# Patient Record
Sex: Female | Born: 1950 | Race: Black or African American | Hispanic: No | Marital: Single | Smoking: Current every day smoker
Health system: Southern US, Community
[De-identification: ages and names within clinical notes are randomized; demographics above are authoritative.]

## PROBLEM LIST (undated history)

## (undated) DIAGNOSIS — F1021 Alcohol dependence, in remission: Secondary | ICD-10-CM

## (undated) DIAGNOSIS — M069 Rheumatoid arthritis, unspecified: Secondary | ICD-10-CM

## (undated) DIAGNOSIS — W3400XA Accidental discharge from unspecified firearms or gun, initial encounter: Secondary | ICD-10-CM

## (undated) DIAGNOSIS — I10 Essential (primary) hypertension: Secondary | ICD-10-CM

## (undated) DIAGNOSIS — T7840XA Allergy, unspecified, initial encounter: Secondary | ICD-10-CM

## (undated) DIAGNOSIS — H409 Unspecified glaucoma: Secondary | ICD-10-CM

## (undated) DIAGNOSIS — D071 Carcinoma in situ of vulva: Secondary | ICD-10-CM

## (undated) DIAGNOSIS — F329 Major depressive disorder, single episode, unspecified: Secondary | ICD-10-CM

## (undated) DIAGNOSIS — F32A Depression, unspecified: Secondary | ICD-10-CM

## (undated) DIAGNOSIS — H269 Unspecified cataract: Secondary | ICD-10-CM

## (undated) HISTORY — DX: Major depressive disorder, single episode, unspecified: F32.9

## (undated) HISTORY — DX: Allergy, unspecified, initial encounter: T78.40XA

## (undated) HISTORY — PX: TOTAL ABDOMINAL HYSTERECTOMY W/ BILATERAL SALPINGOOPHORECTOMY: SHX83

## (undated) HISTORY — DX: Rheumatoid arthritis, unspecified: M06.9

## (undated) HISTORY — DX: Accidental discharge from unspecified firearms or gun, initial encounter: W34.00XA

## (undated) HISTORY — PX: ABDOMINAL HYSTERECTOMY: SHX81

## (undated) HISTORY — PX: CYSTECTOMY: SUR359

## (undated) HISTORY — PX: OTHER SURGICAL HISTORY: SHX169

## (undated) HISTORY — PX: CATARACT EXTRACTION: SUR2

## (undated) HISTORY — PX: CARPAL TUNNEL RELEASE: SHX101

## (undated) HISTORY — DX: Unspecified glaucoma: H40.9

## (undated) HISTORY — DX: Carcinoma in situ of vulva: D07.1

## (undated) HISTORY — DX: Depression, unspecified: F32.A

## (undated) HISTORY — PX: TUBAL LIGATION: SHX77

## (undated) HISTORY — PX: BREAST LUMPECTOMY: SHX2

## (undated) HISTORY — DX: Essential (primary) hypertension: I10

## (undated) HISTORY — DX: Unspecified cataract: H26.9

---

## 2005-09-01 ENCOUNTER — Ambulatory Visit: Payer: Self-pay | Admitting: Family Medicine

## 2005-09-12 ENCOUNTER — Ambulatory Visit: Payer: Self-pay | Admitting: Family Medicine

## 2005-12-15 ENCOUNTER — Ambulatory Visit: Payer: Self-pay | Admitting: Family Medicine

## 2006-03-19 ENCOUNTER — Ambulatory Visit: Payer: Self-pay | Admitting: Family Medicine

## 2011-05-11 ENCOUNTER — Encounter: Payer: Self-pay | Admitting: Rheumatology

## 2011-06-02 ENCOUNTER — Encounter: Payer: Self-pay | Admitting: Rheumatology

## 2012-09-01 HISTORY — PX: COLONOSCOPY: SHX174

## 2012-09-02 ENCOUNTER — Ambulatory Visit: Payer: Self-pay | Admitting: Gastroenterology

## 2012-10-22 ENCOUNTER — Ambulatory Visit: Payer: Self-pay | Admitting: Gynecologic Oncology

## 2012-11-12 ENCOUNTER — Ambulatory Visit: Payer: Self-pay | Admitting: Gynecologic Oncology

## 2012-12-02 ENCOUNTER — Ambulatory Visit: Payer: Self-pay | Admitting: Gynecologic Oncology

## 2012-12-24 ENCOUNTER — Ambulatory Visit: Payer: Self-pay | Admitting: Gynecologic Oncology

## 2012-12-24 DIAGNOSIS — I10 Essential (primary) hypertension: Secondary | ICD-10-CM

## 2012-12-24 LAB — CBC
HGB: 12.7 g/dL (ref 12.0–16.0)
MCH: 35.5 pg — ABNORMAL HIGH (ref 26.0–34.0)
MCV: 103 fL — ABNORMAL HIGH (ref 80–100)
Platelet: 292 10*3/uL (ref 150–440)
RBC: 3.57 10*6/uL — ABNORMAL LOW (ref 3.80–5.20)

## 2012-12-24 LAB — BASIC METABOLIC PANEL
Co2: 28 mmol/L (ref 21–32)
Creatinine: 0.76 mg/dL (ref 0.60–1.30)
EGFR (African American): >60
Glucose: 88 mg/dL (ref 65–99)
Osmolality: 283 (ref 275–301)
Potassium: 3.8 mmol/L (ref 3.5–5.1)
Sodium: 142 mmol/L (ref 136–145)

## 2013-01-01 ENCOUNTER — Ambulatory Visit: Payer: Self-pay | Admitting: Gynecologic Oncology

## 2013-01-01 DIAGNOSIS — D071 Carcinoma in situ of vulva: Secondary | ICD-10-CM

## 2013-01-01 HISTORY — PX: OTHER SURGICAL HISTORY: SHX169

## 2013-01-01 HISTORY — DX: Carcinoma in situ of vulva: D07.1

## 2013-01-07 ENCOUNTER — Ambulatory Visit: Payer: Self-pay | Admitting: Gynecologic Oncology

## 2013-01-08 ENCOUNTER — Ambulatory Visit: Payer: Self-pay | Admitting: Gynecologic Oncology

## 2013-01-09 LAB — PATHOLOGY REPORT

## 2013-02-01 ENCOUNTER — Ambulatory Visit: Payer: Self-pay | Admitting: Gynecologic Oncology

## 2013-02-11 ENCOUNTER — Ambulatory Visit: Payer: Self-pay | Admitting: Gynecologic Oncology

## 2013-03-03 ENCOUNTER — Ambulatory Visit: Payer: Self-pay | Admitting: Gynecologic Oncology

## 2013-04-08 ENCOUNTER — Ambulatory Visit: Payer: Self-pay | Admitting: Gynecologic Oncology

## 2013-05-04 ENCOUNTER — Ambulatory Visit: Payer: Self-pay | Admitting: Gynecologic Oncology

## 2013-09-22 ENCOUNTER — Ambulatory Visit: Payer: Self-pay | Admitting: Gynecologic Oncology

## 2013-10-01 ENCOUNTER — Ambulatory Visit: Payer: Self-pay | Admitting: Gynecologic Oncology

## 2013-12-30 DIAGNOSIS — M059 Rheumatoid arthritis with rheumatoid factor, unspecified: Secondary | ICD-10-CM | POA: Insufficient documentation

## 2014-05-13 ENCOUNTER — Ambulatory Visit: Payer: Self-pay | Admitting: Obstetrics and Gynecology

## 2014-06-02 ENCOUNTER — Ambulatory Visit
Admit: 2014-06-02 | Disposition: A | Discharge status: Home / Self Care | Payer: Self-pay | Attending: Obstetrics and Gynecology | Admitting: Obstetrics and Gynecology

## 2014-07-24 NOTE — Op Note (Signed)
PATIENT NAME:  Summer Marshall, Summer Marshall MR#:  [id number] DATE OF BIRTH:  1950-07-10  DATE OF PROCEDURE:  01/07/2013  PREOPERATIVE DIAGNOSIS: VIN-III.   POSTOPERATIVE DIAGNOSIS: VIN-III.   PROCEDURE PERFORMED: Wide local excision.   SURGEON: Weber Cooks, M.D.   ANESTHESIA: General.   COMPLICATIONS: None.   ESTIMATED BLOOD LOSS: About 25 mL.  INDICATION FOR SURGERY: Mrs. Mee is a 64 year old patient, who presented with vulvar irritation and on examination was noted to have a thickened epithelium on the perineal biopsy of which was consistent with VIN-III. There was no suspicion of invasion;. therefore, the decision was made to proceed with wide local excision.   OPERATIVE REPORT: After adequate general anesthesia being obtained, the patient was prepped and draped in high lithotomy position. Staining of the vagina with Lugol solution failed to reveal any vaginal pathology. The perineal lesion was excised with a 1 cm margin. Then using cautery, the lesion detached from the underlying tissue. The lateral margins were then mobilized by blunt dissection. The vaginal wall was likewise freed from the rectum and mobilized downwards. Hemostasis was achieved with cautery. The subcutaneous tissue was then reapproximated with interrupted 2-0 Vicryl stitches and the skin was closed with 3-0 Vicryl using mattress sutures. Hemostasis was adequate at the end of the procedure. Rectal exam was unremarkable. The patient tolerated the procedure well and was taken to the recovery room in stable condition after Silvadene cream was placed over the incision. Pad, sponge, needle, and instrument counts were correct x 2.    ____________________________ Weber Cooks, MD bem:aw D: 01/08/2013 08:59:05 ET T: 01/08/2013 09:14:53 ET JOB#: [id number]  cc: Weber Cooks, MD, <Dictator> Weber Cooks MD ELECTRONICALLY SIGNED 01/14/2013 17:25

## 2014-11-11 ENCOUNTER — Ambulatory Visit: Payer: Medicare Other

## 2014-11-24 ENCOUNTER — Encounter: Payer: Self-pay | Admitting: *Deleted

## 2014-11-25 ENCOUNTER — Inpatient Hospital Stay: Payer: Medicare Other | Attending: Obstetrics and Gynecology | Admitting: Obstetrics and Gynecology

## 2014-11-25 ENCOUNTER — Encounter: Payer: Self-pay | Admitting: Obstetrics and Gynecology

## 2014-11-25 VITALS — BP 131/83 | HR 73 | Temp 98.2°F | Resp 18 | Ht 61.0 in | Wt 132.5 lb

## 2014-11-25 DIAGNOSIS — R5383 Other fatigue: Secondary | ICD-10-CM | POA: Diagnosis not present

## 2014-11-25 DIAGNOSIS — Z79899 Other long term (current) drug therapy: Secondary | ICD-10-CM | POA: Diagnosis not present

## 2014-11-25 DIAGNOSIS — M069 Rheumatoid arthritis, unspecified: Secondary | ICD-10-CM | POA: Insufficient documentation

## 2014-11-25 DIAGNOSIS — I10 Essential (primary) hypertension: Secondary | ICD-10-CM | POA: Diagnosis not present

## 2014-11-25 DIAGNOSIS — R630 Anorexia: Secondary | ICD-10-CM | POA: Insufficient documentation

## 2014-11-25 DIAGNOSIS — L818 Other specified disorders of pigmentation: Secondary | ICD-10-CM | POA: Diagnosis not present

## 2014-11-25 DIAGNOSIS — Z87412 Personal history of vulvar dysplasia: Secondary | ICD-10-CM | POA: Diagnosis not present

## 2014-11-25 DIAGNOSIS — F17210 Nicotine dependence, cigarettes, uncomplicated: Secondary | ICD-10-CM | POA: Insufficient documentation

## 2014-11-25 DIAGNOSIS — F329 Major depressive disorder, single episode, unspecified: Secondary | ICD-10-CM | POA: Diagnosis not present
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
## 2014-11-25 NOTE — Patient Instructions (Signed)
Calorie Counting for Weight Loss Calories are energy you get from the things you eat and drink. Your body uses this energy to keep you going throughout the day. The number of calories you eat affects your weight. When you eat more calories than your body needs, your body stores the extra calories as fat. When you eat fewer calories than your body needs, your body burns fat to get the energy it needs. Calorie counting means keeping track of how many calories you eat and drink each day. If you make sure to eat fewer calories than your body needs, you should lose weight. In order for calorie counting to work, you will need to eat the number of calories that are right for you in a day to lose a healthy amount of weight per week. A healthy amount of weight to lose per week is usually 1-2 lb (0.5-0.9 kg). A dietitian can determine how many calories you need in a day and give you suggestions on how to reach your calorie goal.  . WHAT DO I NEED TO KNOW ABOUT CALORIE COUNTING? In order to meet your daily calorie goal, you will need to:  Find out how many calories are in each food you would like to eat. Try to do this before you eat.  Decide how much of the food you can eat.  Write down what you ate and how many calories it had. Doing this is called keeping a food log. WHERE DO I FIND CALORIE INFORMATION? The number of calories in a food can be found on a Nutrition Facts label. Note that all the information on a label is based on a specific serving of the food. If a food does not have a Nutrition Facts label, try to look up the calories online or ask your dietitian for help. HOW DO I DECIDE HOW MUCH TO EAT? To decide how much of the food you can eat, you will need to consider both the number of calories in one serving and the size of one serving. This information can be found on the Nutrition Facts label. If a food does not have a Nutrition Facts label, look up the information online or ask your dietitian for  help. Remember that calories are listed per serving. If you choose to have more than one serving of a food, you will have to multiply the calories per serving by the amount of servings you plan to eat. For example, the label on a package of bread might say that a serving size is 1 slice and that there are 90 calories in a serving. If you eat 1 slice, you will have eaten 90 calories. If you eat 2 slices, you will have eaten 180 calories. HOW DO I KEEP A FOOD LOG? After each meal, record the following information in your food log:  What you ate.  How much of it you ate.  How many calories it had.  Then, add up your calories. Keep your food log near you, such as in a small notebook in your pocket. Another option is to use a mobile app or website. Some programs will calculate calories for you and show you how many calories you have left each time you add an item to the log. WHAT ARE SOME CALORIE COUNTING TIPS?  Use your calories on foods and drinks that will fill you up and not leave you hungry. Some examples of this include foods like nuts and nut butters, vegetables, lean proteins, and high-fiber foods (more  than 5 g fiber per serving).  Eat nutritious foods and avoid empty calories. Empty calories are calories you get from foods or beverages that do not have many nutrients, such as candy and soda. It is better to have a nutritious high-calorie food (such as an avocado) than a food with few nutrients (such as a bag of chips).  Know how many calories are in the foods you eat most often. This way, you do not have to look up how many calories they have each time you eat them.  Look out for foods that may seem like low-calorie foods but are really high-calorie foods, such as baked goods, soda, and fat-free candy.  Pay attention to calories in drinks. Drinks such as sodas, specialty coffee drinks, alcohol, and juices have a lot of calories yet do not fill you up. Choose low-calorie drinks like water  and diet drinks.  Focus your calorie counting efforts on higher calorie items. Logging the calories in a garden salad that contains only vegetables is less important than calculating the calories in a milk shake.  Find a way of tracking calories that works for you. Get creative. Most people who are successful find ways to keep track of how much they eat in a day, even if they do not count every calorie. WHAT ARE SOME PORTION CONTROL TIPS?  Know how many calories are in a serving. This will help you know how many servings of a certain food you can have.  Use a measuring cup to measure serving sizes. This is helpful when you start out. With time, you will be able to estimate serving sizes for some foods.  Take some time to put servings of different foods on your favorite plates, bowls, and cups so you know what a serving looks like.  Try not to eat straight from a bag or box. Doing this can lead to overeating. Put the amount you would like to eat in a cup or on a plate to make sure you are eating the right portion.  Use smaller plates, glasses, and bowls to prevent overeating. This is a quick and easy way to practice portion control. If your plate is smaller, less food can fit on it.  Try not to multitask while eating, such as watching TV or using your computer. If it is time to eat, sit down at a table and enjoy your food. Doing this will help you to start recognizing when you are full. It will also make you more aware of what and how much you are eating. HOW CAN I CALORIE COUNT WHEN EATING OUT?  Ask for smaller portion sizes or child-sized portions.  Consider sharing an entree and sides instead of getting your own entree.  If you get your own entree, eat only half. Ask for a box at the beginning of your meal and put the rest of your entree in it so you are not tempted to eat it.  Look for the calories on the menu. If calories are listed, choose the lower calorie options.  Choose dishes  that include vegetables, fruits, whole grains, low-fat dairy products, and lean protein. Focusing on smart food choices from each of the 5 food groups can help you stay on track at restaurants.  Choose items that are boiled, broiled, grilled, or steamed.  Choose water, milk, unsweetened iced tea, or other drinks without added sugars. If you want an alcoholic beverage, choose a lower calorie option. For example, a regular margarita can have up   to 700 calories and a glass of wine has around 150.  Stay away from items that are buttered, battered, fried, or served with cream sauce. Items labeled "crispy" are usually fried, unless stated otherwise.  Ask for dressings, sauces, and syrups on the side. These are usually very high in calories, so do not eat much of them.  Watch out for salads. Many people think salads are a healthy option, but this is often not the case. Many salads come with bacon, fried chicken, lots of cheese, fried chips, and dressing. All of these items have a lot of calories. If you want a salad, choose a garden salad and ask for grilled meats or steak. Ask for the dressing on the side, or ask for olive oil and vinegar or lemon to use as dressing.  Estimate how many servings of a food you are given. For example, a serving of cooked rice is  cup or about the size of half a tennis ball or one cupcake wrapper. Knowing serving sizes will help you be aware of how much food you are eating at restaurants. The list below tells you how big or small some common portion sizes are based on everyday objects.  1 oz--4 stacked dice.  3 oz--1 deck of cards.  1 tsp--1 dice.  1 Tbsp-- a Ping-Pong ball.  2 Tbsp--1 Ping-Pong ball.   cup--1 tennis ball or 1 cupcake wrapper.  1 cup--1 baseball. Document Released: 03/20/2005 Document Revised: 08/04/2013 Document Reviewed: 01/23/2013 [facility] Patient Information 2015 [place], [place]. This information is not intended to replace advice given to  you by your health care provider. Make sure you discuss any questions you have with your health care provider.   Exercise to Lose Weight Exercise and a healthy diet may help you lose weight. Your doctor may suggest specific exercises. EXERCISE IDEAS AND TIPS  Choose low-cost things you enjoy doing, such as walking, bicycling, or exercising to workout videos.  Take stairs instead of the elevator.  Walk during your lunch break.  Park your car further away from work or school.  Go to a gym or an exercise class.  Start with 5 to 10 minutes of exercise each day. Build up to 30 minutes of exercise 4 to 6 days a week.  Wear shoes with good support and comfortable clothes.  Stretch before and after working out.  Work out until you breathe harder and your heart beats faster.  Drink extra water when you exercise.  Do not do so much that you hurt yourself, feel dizzy, or get very short of breath. Exercises that burn about 150 calories:  Running 1  miles in 15 minutes.  Playing volleyball for 45 to 60 minutes.  Washing and waxing a car for 45 to 60 minutes.  Playing touch football for 45 minutes.  Walking 1  miles in 35 minutes.  Pushing a stroller 1  miles in 30 minutes.  Playing basketball for 30 minutes.  Raking leaves for 30 minutes.  Bicycling 5 miles in 30 minutes.  Walking 2 miles in 30 minutes.  Dancing for 30 minutes.  Shoveling snow for 15 minutes.  Swimming laps for 20 minutes.  Walking up stairs for 15 minutes.  Bicycling 4 miles in 15 minutes.  Gardening for 30 to 45 minutes.  Jumping rope for 15 minutes.  Washing windows or floors for 45 to 60 minutes. Document Released: 04/22/2010 Document Revised: 06/12/2011 Document Reviewed: 04/22/2010 [facility] Patient Information 2015 [place], [place]. This information is not intended  to replace advice given to you by your health care provider. Make sure you discuss any questions you have with your health  care provider.  Smoking Cessation Quitting smoking is important to your health and has many advantages. However, it is not always easy to quit since nicotine is a very addictive drug. Oftentimes, people try 3 times or more before being able to quit. This document explains the best ways for you to prepare to quit smoking. Quitting takes hard work and a lot of effort, but you can do it. ADVANTAGES OF QUITTING SMOKING  You will live longer, feel better, and live better.  Your body will feel the impact of quitting smoking almost immediately.  Within 20 minutes, blood pressure decreases. Your pulse returns to its normal level.  After 8 hours, carbon monoxide levels in the blood return to normal. Your oxygen level increases.  After 24 hours, the chance of having a heart attack starts to decrease. Your breath, hair, and body stop smelling like smoke.  After 48 hours, damaged nerve endings begin to recover. Your sense of taste and smell improve.  After 72 hours, the body is virtually free of nicotine. Your bronchial tubes relax and breathing becomes easier.  After 2 to 12 weeks, lungs can hold more air. Exercise becomes easier and circulation improves.  The risk of having a heart attack, stroke, cancer, or lung disease is greatly reduced.  After 1 year, the risk of coronary heart disease is cut in half.  After 5 years, the risk of stroke falls to the same as a nonsmoker.  After 10 years, the risk of lung cancer is cut in half and the risk of other cancers decreases significantly.  After 15 years, the risk of coronary heart disease drops, usually to the level of a nonsmoker.  If you are pregnant, quitting smoking will improve your chances of having a healthy baby.  The people you live with, especially any children, will be healthier.  You will have extra money to spend on things other than cigarettes. QUESTIONS TO THINK ABOUT BEFORE ATTEMPTING TO QUIT You may want to talk about your  answers with your health care provider.  Why do you want to quit?  If you tried to quit in the past, what helped and what did not?  What will be the most difficult situations for you after you quit? How will you plan to handle them?  Who can help you through the tough times? Your family? Friends? A health care provider?  What pleasures do you get from smoking? What ways can you still get pleasure if you quit? Here are some questions to ask your health care provider:  How can you help me to be successful at quitting?  What medicine do you think would be best for me and how should I take it?  What should I do if I need more help?  What is smoking withdrawal like? How can I get information on withdrawal? GET READY  Set a quit date.  Change your environment by getting rid of all cigarettes, ashtrays, matches, and lighters in your home, car, or work. Do not let people smoke in your home.  Review your past attempts to quit. Think about what worked and what did not. GET SUPPORT AND ENCOURAGEMENT You have a better chance of being successful if you have help. You can get support in many ways.  Tell your family, friends, and coworkers that you are going to quit and need their support.  Ask them not to smoke around you.  Get individual, group, or telephone counseling and support. Programs are available at [employer] and health centers. Call your local health department for information about programs in your area.  Spiritual beliefs and practices may help some smokers quit.  Download a "quit meter" on your computer to keep track of quit statistics, such as how long you have gone without smoking, cigarettes not smoked, and money saved.  Get a self-help book about quitting smoking and staying off tobacco. [place] yourself from urges to smoke. Talk to someone, go for a walk, or occupy your time with a task.  Change your normal routine. Take a different  route to work. Drink tea instead of coffee. Eat breakfast in a different place.  Reduce your stress. Take a hot bath, exercise, or read a book.  Plan something enjoyable to do every day. Reward yourself for not smoking.  Explore interactive web-based programs that specialize in helping you quit. GET MEDICINE AND USE IT CORRECTLY Medicines can help you stop smoking and decrease the urge to smoke. Combining medicine with the above behavioral methods and support can greatly increase your chances of successfully quitting smoking.  Nicotine replacement therapy helps deliver nicotine to your body without the negative effects and risks of smoking. Nicotine replacement therapy includes nicotine gum, lozenges, inhalers, nasal sprays, and skin patches. Some may be available over-the-counter and others require a prescription.  Antidepressant medicine helps people abstain from smoking, but how this works is unknown. This medicine is available by prescription.  Nicotinic receptor partial agonist medicine simulates the effect of nicotine in your brain. This medicine is available by prescription. Ask your health care provider for advice about which medicines to use and how to use them based on your health history. Your health care provider will tell you what side effects to look out for if you choose to be on a medicine or therapy. Carefully read the information on the package. Do not use any other product containing nicotine while using a nicotine replacement product.  RELAPSE OR DIFFICULT SITUATIONS Most relapses occur within the first 3 months after quitting. Do not be discouraged if you start smoking again. Remember, most people try several times before finally quitting. You may have symptoms of withdrawal because your body is used to nicotine. You may crave cigarettes, be irritable, feel very hungry, cough often, get headaches, or have difficulty concentrating. The withdrawal symptoms are only temporary. They  are strongest when you first quit, but they will go away within 10-14 days. To reduce the chances of relapse, try to:  Avoid drinking alcohol. Drinking lowers your chances of successfully quitting.  Reduce the amount of caffeine you consume. Once you quit smoking, the amount of caffeine in your body increases and can give you symptoms, such as a rapid heartbeat, sweating, and anxiety.  Avoid smokers because they can make you want to smoke.  Do not let weight gain distract you. Many smokers will gain weight when they quit, usually less than 10 pounds. Eat a healthy diet and stay active. You can always lose the weight gained after you quit.  Find ways to improve your mood other than smoking. FOR MORE INFORMATION  www.smokefree.gov  Document Released: 03/14/2001 Document Revised: 08/04/2013 Document Reviewed: 06/29/2011 [facility] Patient Information 2015 [place], [place]. This information is not intended to replace advice given to you by your health care provider. Make sure you discuss any questions you have with  your health care provider.

## 2014-11-25 NOTE — Progress Notes (Signed)
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 States she has been doing well. Reports decreased appetite, fatigue, depression and leg swelling after standing up all day. Denies any vulvar itching or irritation, discharge or bleeding.

## 2015-05-26 ENCOUNTER — Ambulatory Visit: Payer: Medicare Other

## 2015-06-09 ENCOUNTER — Inpatient Hospital Stay: Payer: Medicare Other | Attending: Obstetrics and Gynecology | Admitting: Obstetrics and Gynecology

## 2015-06-09 VITALS — BP 136/86 | HR 76 | Temp 98.8°F | Ht 61.0 in | Wt 129.0 lb

## 2015-06-09 DIAGNOSIS — Z79899 Other long term (current) drug therapy: Secondary | ICD-10-CM | POA: Diagnosis not present

## 2015-06-09 DIAGNOSIS — F329 Major depressive disorder, single episode, unspecified: Secondary | ICD-10-CM | POA: Insufficient documentation

## 2015-06-09 DIAGNOSIS — M069 Rheumatoid arthritis, unspecified: Secondary | ICD-10-CM | POA: Insufficient documentation

## 2015-06-09 DIAGNOSIS — F1021 Alcohol dependence, in remission: Secondary | ICD-10-CM | POA: Diagnosis not present

## 2015-06-09 DIAGNOSIS — F17210 Nicotine dependence, cigarettes, uncomplicated: Secondary | ICD-10-CM | POA: Diagnosis not present

## 2015-06-09 DIAGNOSIS — Z87412 Personal history of vulvar dysplasia: Secondary | ICD-10-CM | POA: Diagnosis present

## 2015-06-09 DIAGNOSIS — H409 Unspecified glaucoma: Secondary | ICD-10-CM | POA: Insufficient documentation

## 2015-06-09 DIAGNOSIS — I10 Essential (primary) hypertension: Secondary | ICD-10-CM | POA: Insufficient documentation

## 2015-06-09 DIAGNOSIS — F32A Depression, unspecified: Secondary | ICD-10-CM | POA: Insufficient documentation

## 2015-06-09 NOTE — Progress Notes (Signed)
  Oncology Nurse Navigator Documentation  Navigator Location: [facility] (06/09/15 1100) Navigator Encounter Type: [facility] Follow-up (06/09/15 1100)             Treatment Phase: Follow-up (06/09/15 1100)                  Acuity: Level 1 (06/09/15 1100) Acuity Level 1: Initial guidance, education and coordination as needed;Minimal follow up required (06/09/15 1100)       Time Spent with Patient: 15 (06/09/15 1100)   Chaperoned pelvic exam. Provided patient with name of Gyn to follow up in one year. Dr Leafy Ro at [facility]. Provided phone number
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
## 2016-02-10 ENCOUNTER — Other Ambulatory Visit: Payer: Self-pay | Admitting: Family Medicine

## 2016-02-10 DIAGNOSIS — Z1239 Encounter for other screening for malignant neoplasm of breast: Secondary | ICD-10-CM

## 2016-02-23 ENCOUNTER — Other Ambulatory Visit: Payer: Self-pay | Admitting: Family Medicine

## 2016-02-23 DIAGNOSIS — N63 Unspecified lump in unspecified breast: Secondary | ICD-10-CM

## 2016-03-28 ENCOUNTER — Ambulatory Visit: Payer: Medicare Other

## 2016-03-28 ENCOUNTER — Other Ambulatory Visit: Payer: Medicare Other

## 2016-04-18 ENCOUNTER — Ambulatory Visit
Admission: RE | Admit: 2016-04-18 | Discharge: 2016-04-18 | Disposition: A | Discharge status: Home / Self Care | Payer: Medicare Other | Source: Ambulatory Visit | Attending: Family Medicine | Admitting: Family Medicine

## 2016-04-18 DIAGNOSIS — N63 Unspecified lump in unspecified breast: Secondary | ICD-10-CM

## 2016-04-18 DIAGNOSIS — N6321 Unspecified lump in the left breast, upper outer quadrant: Secondary | ICD-10-CM | POA: Insufficient documentation

## 2017-05-11 ENCOUNTER — Ambulatory Visit (INDEPENDENT_AMBULATORY_CARE_PROVIDER_SITE_OTHER): Payer: Medicare Other | Admitting: Psychiatry

## 2017-05-11 ENCOUNTER — Encounter: Payer: Self-pay | Admitting: Psychiatry

## 2017-05-11 ENCOUNTER — Other Ambulatory Visit: Payer: Self-pay

## 2017-05-11 VITALS — BP 157/88 | HR 79 | Temp 97.9°F | Wt 132.4 lb

## 2017-05-11 DIAGNOSIS — F410 Panic disorder [episodic paroxysmal anxiety] without agoraphobia: Secondary | ICD-10-CM

## 2017-05-11 DIAGNOSIS — F17200 Nicotine dependence, unspecified, uncomplicated: Secondary | ICD-10-CM | POA: Diagnosis not present

## 2017-05-11 DIAGNOSIS — F1011 Alcohol abuse, in remission: Secondary | ICD-10-CM

## 2017-05-11 DIAGNOSIS — F330 Major depressive disorder, recurrent, mild: Secondary | ICD-10-CM | POA: Diagnosis not present

## 2017-05-11 MED ORDER — BUPROPION HCL ER (XL) 150 MG PO TB24: 150.0000 mg | ORAL_TABLET | Freq: Every day | ORAL | 0 refills | Status: DC

## 2017-05-11 MED ORDER — PAROXETINE HCL 20 MG PO TABS: 40.0000 mg | ORAL_TABLET | Freq: Every day | ORAL | 0 refills | Status: DC

## 2017-05-11 MED ORDER — TRAZODONE HCL 50 MG PO TABS: 25.0000 mg | ORAL_TABLET | Freq: Every evening | ORAL | 0 refills | Status: DC | PRN
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
## 2017-05-11 NOTE — Patient Instructions (Signed)
Insomnia Insomnia is a sleep disorder that makes it difficult to fall asleep or to stay asleep. Insomnia can cause tiredness (fatigue), low energy, difficulty concentrating, mood swings, and poor performance at work or school. There are three different ways to classify insomnia:  Difficulty falling asleep.  Difficulty staying asleep.  Waking up too early in the morning.  Any type of insomnia can be long-term (chronic) or short-term (acute). Both are common. Short-term insomnia usually lasts for three months or less. Chronic insomnia occurs at least three times a week for longer than three months. What are the causes? Insomnia may be caused by another condition, situation, or substance, such as:  Anxiety.  Certain medicines.  Gastroesophageal reflux disease (GERD) or other gastrointestinal conditions.  Asthma or other breathing conditions.  Restless legs syndrome, sleep apnea, or other sleep disorders.  Chronic pain.  Menopause. This may include hot flashes.  Stroke.  Abuse of alcohol, tobacco, or illegal drugs.  Depression.  Caffeine.  Neurological disorders, such as Alzheimer disease.  An overactive thyroid (hyperthyroidism).  The cause of insomnia may not be known. What increases the risk? Risk factors for insomnia include:  Gender. Women are more commonly affected than men.  Age. Insomnia is more common as you get older.  Stress. This may involve your professional or personal life.  Income. Insomnia is more common in people with lower income.  Lack of exercise.  Irregular work schedule or night shifts.  Traveling between different time zones.  What are the signs or symptoms? If you have insomnia, trouble falling asleep or trouble staying asleep is the main symptom. This may lead to other symptoms, such as:  Feeling fatigued.  Feeling nervous about going to sleep.  Not feeling rested in the morning.  Having trouble concentrating.  Feeling  irritable, anxious, or depressed.  How is this treated? Treatment for insomnia depends on the cause. If your insomnia is caused by an underlying condition, treatment will focus on addressing the condition. Treatment may also include:  Medicines to help you sleep.  Counseling or therapy.  Lifestyle adjustments.  Follow these instructions at home:  Take medicines only as directed by your health care provider.  Keep regular sleeping and waking hours. Avoid naps.  Keep a sleep diary to help you and your health care provider figure out what could be causing your insomnia. Include: ? When you sleep. ? When you wake up during the night. ? How well you sleep. ? How rested you feel the next day. ? Any side effects of medicines you are taking. ? What you eat and drink.  Make your bedroom a comfortable place where it is easy to fall asleep: ? Put up shades or special blackout curtains to block light from outside. ? Use a white noise machine to block noise. ? Keep the temperature cool.  Exercise regularly as directed by your health care provider. Avoid exercising right before bedtime.  Use relaxation techniques to manage stress. Ask your health care provider to suggest some techniques that may work well for you. These may include: ? Breathing exercises. ? Routines to release muscle tension. ? Visualizing peaceful scenes.  Cut back on alcohol, caffeinated beverages, and cigarettes, especially close to bedtime. These can disrupt your sleep.  Do not overeat or eat spicy foods right before bedtime. This can lead to digestive discomfort that can make it hard for you to sleep.  Limit screen use before bedtime. This includes: ? Watching TV. ? Using your smartphone, tablet, and   computer.  Stick to a routine. This can help you fall asleep faster. Try to do a quiet activity, brush your teeth, and go to bed at the same time each night.  Get out of bed if you are still awake after 15 minutes  of trying to sleep. Keep the lights down, but try reading or doing a quiet activity. When you feel sleepy, go back to bed.  Make sure that you drive carefully. Avoid driving if you feel very sleepy.  Keep all follow-up appointments as directed by your health care provider. This is important. Contact a health care provider if:  You are tired throughout the day or have trouble in your daily routine due to sleepiness.  You continue to have sleep problems or your sleep problems get worse. Get help right away if:  You have serious thoughts about hurting yourself or someone else. This information is not intended to replace advice given to you by your health care provider. Make sure you discuss any questions you have with your health care provider. Document Released: 03/17/2000 Document Revised: 08/20/2015 Document Reviewed: 12/19/2013 Elsevier Interactive Patient Education  2018 [employer]. Trazodone tablets What is this medicine? TRAZODONE (TRAZ oh done) is used to treat depression. This medicine may be used for other purposes; ask your health care provider or pharmacist if you have questions. COMMON BRAND NAME(S): Desyrel What should I tell my health care provider before I take this medicine? They need to know if you have any of these conditions: -attempted suicide or thinking about it -bipolar disorder -bleeding problems -glaucoma -heart disease, or previous heart attack -irregular heart beat -kidney or liver disease -low levels of sodium in the blood -an unusual or allergic reaction to trazodone, other medicines, foods, dyes or preservatives -pregnant or trying to get pregnant -breast-feeding How should I use this medicine? Take this medicine by mouth with a glass of water. Follow the directions on the prescription label. Take this medicine shortly after a meal or a light snack. Take your medicine at regular intervals. Do not take your medicine more often than directed. Do not  stop taking this medicine suddenly except upon the advice of your doctor. Stopping this medicine too quickly may cause serious side effects or your condition may worsen. A special MedGuide will be given to you by the pharmacist with each prescription and refill. Be sure to read this information carefully each time. Talk to your pediatrician regarding the use of this medicine in children. Special care may be needed. Overdosage: If you think you have taken too much of this medicine contact a poison control center or emergency room at once. NOTE: This medicine is only for you. Do not share this medicine with others. What if I miss a dose? If you miss a dose, take it as soon as you can. If it is almost time for your next dose, take only that dose. Do not take double or extra doses. What may interact with this medicine? Do not take this medicine with any of the following medications: -certain medicines for fungal infections like fluconazole, itraconazole, ketoconazole, posaconazole, voriconazole -cisapride -dofetilide -dronedarone -linezolid -MAOIs like Carbex, Eldepryl, Marplan, Nardil, and Parnate -mesoridazine -methylene blue (injected into a vein) -pimozide -saquinavir -thioridazine -ziprasidone This medicine may also interact with the following medications: -alcohol -antiviral medicines for HIV or AIDS -aspirin and aspirin-like medicines -barbiturates like phenobarbital -certain medicines for blood pressure, heart disease, irregular heart beat -certain medicines for depression, anxiety, or psychotic disturbances -certain medicines for  migraine headache like almotriptan, eletriptan, frovatriptan, naratriptan, rizatriptan, sumatriptan, zolmitriptan -certain medicines for seizures like carbamazepine and phenytoin -certain medicines for sleep -certain medicines that treat or prevent blood clots like dalteparin, enoxaparin, warfarin -digoxin -fentanyl -lithium -NSAIDS, medicines for  pain and inflammation, like ibuprofen or naproxen -other medicines that prolong the QT interval (cause an abnormal heart rhythm) -rasagiline -supplements like St. John's wort, kava kava, valerian -tramadol -tryptophan This list may not describe all possible interactions. Give your health care provider a list of all the medicines, herbs, non-prescription drugs, or dietary supplements you use. Also tell them if you smoke, drink alcohol, or use illegal drugs. Some items may interact with your medicine. What should I watch for while using this medicine? Tell your doctor if your symptoms do not get better or if they get worse. Visit your doctor or health care professional for regular checks on your progress. Because it may take several weeks to see the full effects of this medicine, it is important to continue your treatment as prescribed by your doctor. Patients and their families should watch out for new or worsening thoughts of suicide or depression. Also watch out for sudden changes in feelings such as feeling anxious, agitated, panicky, irritable, hostile, aggressive, impulsive, severely restless, overly excited and hyperactive, or not being able to sleep. If this happens, especially at the beginning of treatment or after a change in dose, call your health care professional. Dennis Bast may get drowsy or dizzy. Do not drive, use machinery, or do anything that needs mental alertness until you know how this medicine affects you. Do not stand or sit up quickly, especially if you are an older patient. This reduces the risk of dizzy or fainting spells. Alcohol may interfere with the effect of this medicine. Avoid alcoholic drinks. This medicine may cause dry eyes and blurred vision. If you wear contact lenses you may feel some discomfort. Lubricating drops may help. See your eye doctor if the problem does not go away or is severe. Your mouth may get dry. Chewing sugarless gum, sucking hard candy and drinking plenty  of water may help. Contact your doctor if the problem does not go away or is severe. What side effects may I notice from receiving this medicine? Side effects that you should report to your doctor or health care professional as soon as possible: -allergic reactions like skin rash, itching or hives, swelling of the face, lips, or tongue -elevated mood, decreased need for sleep, racing thoughts, impulsive behavior -confusion -fast, irregular heartbeat -feeling faint or lightheaded, falls -feeling agitated, angry, or irritable -loss of balance or coordination -painful or prolonged erections -restlessness, pacing, inability to keep still -suicidal thoughts or other mood changes -tremors -trouble sleeping -seizures -unusual bleeding or bruising Side effects that usually do not require medical attention (report to your doctor or health care professional if they continue or are bothersome): -change in sex drive or performance -change in appetite or weight -constipation -headache -muscle aches or pains -nausea This list may not describe all possible side effects. Call your doctor for medical advice about side effects. You may report side effects to FDA at 1-800-FDA-1088. Where should I keep my medicine? Keep out of the reach of children. Store at room temperature between 15 and 30 degrees C (59 to 86 degrees F). Protect from light. Keep container tightly closed. Throw away any unused medicine after the expiration date. NOTE: This sheet is a summary. It may not cover all possible information. If you have questions  about this medicine, talk to your doctor, pharmacist, or health care provider.  2018 Elsevier/Gold Standard (2015-08-19 16:57:05)

## 2017-06-29 ENCOUNTER — Other Ambulatory Visit: Payer: Self-pay | Admitting: Psychiatry

## 2017-06-29 DIAGNOSIS — F330 Major depressive disorder, recurrent, mild: Secondary | ICD-10-CM

## 2017-06-30 ENCOUNTER — Other Ambulatory Visit: Payer: Self-pay | Admitting: Psychiatry

## 2017-06-30 DIAGNOSIS — F330 Major depressive disorder, recurrent, mild: Secondary | ICD-10-CM

## 2017-06-30 DIAGNOSIS — F410 Panic disorder [episodic paroxysmal anxiety] without agoraphobia: Secondary | ICD-10-CM

## 2017-08-03 ENCOUNTER — Ambulatory Visit: Payer: Medicare Other | Admitting: Psychiatry

## 2017-08-07 ENCOUNTER — Other Ambulatory Visit: Payer: Self-pay

## 2017-08-07 ENCOUNTER — Ambulatory Visit (INDEPENDENT_AMBULATORY_CARE_PROVIDER_SITE_OTHER): Payer: Medicare Other | Admitting: Psychiatry

## 2017-08-07 ENCOUNTER — Encounter: Payer: Self-pay | Admitting: Psychiatry

## 2017-08-07 VITALS — BP 170/81 | HR 79 | Temp 98.3°F | Wt 131.6 lb

## 2017-08-07 DIAGNOSIS — F410 Panic disorder [episodic paroxysmal anxiety] without agoraphobia: Secondary | ICD-10-CM | POA: Diagnosis not present

## 2017-08-07 DIAGNOSIS — F1011 Alcohol abuse, in remission: Secondary | ICD-10-CM

## 2017-08-07 DIAGNOSIS — F330 Major depressive disorder, recurrent, mild: Secondary | ICD-10-CM

## 2017-08-07 DIAGNOSIS — F17200 Nicotine dependence, unspecified, uncomplicated: Secondary | ICD-10-CM | POA: Diagnosis not present

## 2017-08-07 MED ORDER — TRAZODONE HCL 50 MG PO TABS: 50.0000 mg | ORAL_TABLET | Freq: Every day | ORAL | 1 refills | Status: DC
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
## 2017-08-17 ENCOUNTER — Other Ambulatory Visit: Payer: Self-pay | Admitting: Family Medicine

## 2017-08-17 DIAGNOSIS — Z1231 Encounter for screening mammogram for malignant neoplasm of breast: Secondary | ICD-10-CM

## 2017-10-05 ENCOUNTER — Telehealth: Payer: Self-pay | Admitting: Psychiatry

## 2017-10-05 DIAGNOSIS — F410 Panic disorder [episodic paroxysmal anxiety] without agoraphobia: Secondary | ICD-10-CM

## 2017-10-05 DIAGNOSIS — F330 Major depressive disorder, recurrent, mild: Secondary | ICD-10-CM

## 2017-10-05 MED ORDER — PAROXETINE HCL 20 MG PO TABS: 40.0000 mg | ORAL_TABLET | Freq: Every day | ORAL | 0 refills | Status: DC

## 2017-10-05 MED ORDER — BUPROPION HCL ER (XL) 150 MG PO TB24: 150.0000 mg | ORAL_TABLET | Freq: Every day | ORAL | 0 refills | Status: DC

## 2017-10-05 MED ORDER — TRAZODONE HCL 50 MG PO TABS: 50.0000 mg | ORAL_TABLET | Freq: Every day | ORAL | 1 refills | Status: DC

## 2017-10-05 NOTE — Telephone Encounter (Signed)
Sent new refills for paxil, wellbutrin, trazodone to pharmacy since writer will not be available in July.

## 2017-10-23 ENCOUNTER — Ambulatory Visit: Payer: Medicare Other | Admitting: Psychiatry

## 2017-11-06 ENCOUNTER — Ambulatory Visit (INDEPENDENT_AMBULATORY_CARE_PROVIDER_SITE_OTHER): Payer: Medicare Other | Admitting: Psychiatry

## 2017-11-06 ENCOUNTER — Encounter: Payer: Self-pay | Admitting: Psychiatry

## 2017-11-06 ENCOUNTER — Other Ambulatory Visit: Payer: Self-pay

## 2017-11-06 VITALS — BP 132/85 | HR 78 | Temp 98.5°F | Wt 134.4 lb

## 2017-11-06 DIAGNOSIS — F5105 Insomnia due to other mental disorder: Secondary | ICD-10-CM

## 2017-11-06 DIAGNOSIS — F330 Major depressive disorder, recurrent, mild: Secondary | ICD-10-CM

## 2017-11-06 DIAGNOSIS — F17200 Nicotine dependence, unspecified, uncomplicated: Secondary | ICD-10-CM

## 2017-11-06 DIAGNOSIS — F1011 Alcohol abuse, in remission: Secondary | ICD-10-CM

## 2017-11-06 DIAGNOSIS — F410 Panic disorder [episodic paroxysmal anxiety] without agoraphobia: Secondary | ICD-10-CM | POA: Diagnosis not present

## 2017-11-06 MED ORDER — DOXEPIN HCL 10 MG PO CAPS: 10.0000 mg | ORAL_CAPSULE | Freq: Every day | ORAL | 1 refills | Status: DC

## 2017-11-06 NOTE — Patient Instructions (Signed)
Coping with Quitting Smoking Quitting smoking is a physical and mental challenge. You will face cravings, withdrawal symptoms, and temptation. Before quitting, work with your health care provider to make a plan that can help you cope. Preparation can help you quit and keep you from giving in. How can I cope with cravings? Cravings usually last for 5-10 minutes. If you get through it, the craving will pass. Consider taking the following actions to help you cope with cravings:  Keep your mouth busy: ? Chew sugar-free gum. ? Suck on hard candies or a straw. ? Brush your teeth.  Keep your hands and body busy: ? Immediately change to a different activity when you feel a craving. ? Squeeze or play with a ball. ? Do an activity or a hobby, like making bead jewelry, practicing needlepoint, or working with wood. ? Mix up your normal routine. ? Take a short exercise break. Go for a quick walk or run up and down stairs. ? Spend time in public places where smoking is not allowed.  Focus on doing something kind or helpful for someone else.  Call a friend or family member to talk during a craving.  Join a support group.  Call a quit line, such as 1-800-QUIT-NOW.  Talk with your health care provider about medicines that might help you cope with cravings and make quitting easier for you.  How can I deal with withdrawal symptoms? Your body may experience negative effects as it tries to get used to not having nicotine in the system. These effects are called withdrawal symptoms. They may include:  Feeling hungrier than normal.  Trouble concentrating.  Irritability.  Trouble sleeping.  Feeling depressed.  Restlessness and agitation.  Craving a cigarette.  To manage withdrawal symptoms:  Avoid places, people, and activities that trigger your cravings.  Remember why you want to quit.  Get plenty of sleep.  Avoid coffee and other caffeinated drinks. These may worsen some of your  symptoms.  How can I handle social situations? Social situations can be difficult when you are quitting smoking, especially in the first few weeks. To manage this, you can:  Avoid parties, bars, and other social situations where people might be smoking.  Avoid alcohol.  Leave right away if you have the urge to smoke.  Explain to your family and friends that you are quitting smoking. Ask for understanding and support.  Plan activities with friends or family where smoking is not an option.  What are some ways I can cope with stress? Wanting to smoke may cause stress, and stress can make you want to smoke. Find ways to manage your stress. Relaxation techniques can help. For example:  Breathe slowly and deeply, in through your nose and out through your mouth.  Listen to soothing, relaxing music.  Talk with a family member or friend about your stress.  Light a candle.  Soak in a bath or take a shower.  Think about a peaceful place.  What are some ways I can prevent weight gain? Be aware that many people gain weight after they quit smoking. However, not everyone does. To keep from gaining weight, have a plan in place before you quit and stick to the plan after you quit. Your plan should include:  Having healthy snacks. When you have a craving, it may help to: ? Eat plain popcorn, crunchy carrots, celery, or other cut vegetables. ? Chew sugar-free gum.  Changing how you eat: ? Eat small portion sizes at meals. ?   Eat 4-6 small meals throughout the day instead of 1-2 large meals a day. ? Be mindful when you eat. Do not watch television or do other things that might distract you as you eat.  Exercising regularly: ? Make time to exercise each day. If you do not have time for a long workout, do short bouts of exercise for 5-10 minutes several times a day. ? Do some form of strengthening exercise, like weight lifting, and some form of aerobic exercise, like running or  swimming.  Drinking plenty of water or other low-calorie or no-calorie drinks. Drink 6-8 glasses of water daily, or as much as instructed by your health care provider.  Summary  Quitting smoking is a physical and mental challenge. You will face cravings, withdrawal symptoms, and temptation to smoke again. Preparation can help you as you go through these challenges.  You can cope with cravings by keeping your mouth busy (such as by chewing gum), keeping your body and hands busy, and making calls to family, friends, or a helpline for people who want to quit smoking.  You can cope with withdrawal symptoms by avoiding places where people smoke, avoiding drinks with caffeine, and getting plenty of rest.  Ask your health care provider about the different ways to prevent weight gain, avoid stress, and handle social situations. This information is not intended to replace advice given to you by your health care provider. Make sure you discuss any questions you have with your health care provider. Document Released: 03/17/2016 Document Revised: 03/17/2016 Document Reviewed: 03/17/2016 Elsevier Interactive Patient Education  2018 [employer]. Doxepin capsules What is this medicine? DOXEPIN (DOX e pin) is used to treat depression and anxiety. This medicine may be used for other purposes; ask your health care provider or pharmacist if you have questions. COMMON BRAND NAME(S): Sinequan What should I tell my health care provider before I take this medicine? They need to know if you have any of these conditions: -bipolar disorder -difficulty passing urine -glaucoma -heart disease -if you frequently drink alcohol containing drinks -liver disease -lung or breathing disease, like asthma or sleep apnea -prostate trouble -schizophrenia -seizures -suicidal thoughts, plans, or attempt; a previous suicide attempt by you or a family member -an unusual or allergic reaction to doxepin, other medicines,  foods, dyes, or preservatives -pregnant or trying to get pregnant -breast-feeding How should I use this medicine? Take this medicine by mouth with a glass of water. Follow the directions on the prescription label. Take your doses at regular intervals. Do not take your medicine more often than directed. Do not stop taking this medicine suddenly except upon the advice of your doctor. Stopping this medicine too quickly may cause serious side effects or your condition may worsen. A special MedGuide will be given to you by the pharmacist with each prescription and refill. Be sure to read this information carefully each time. Talk to your pediatrician regarding the use of this medicine in children. While this drug may be prescribed for children as young as 12 years for selected conditions, precautions do apply. Overdosage: If you think you have taken too much of this medicine contact a poison control center or emergency room at once. NOTE: This medicine is only for you. Do not share this medicine with others. What if I miss a dose? If you miss a dose, take it as soon as you can. If it is almost time for your next dose, take only that dose. Do not take double or extra doses.  What may interact with this medicine? Do not take this medicine with any of the following medications: -arsenic trioxide -certain medicines used to regulate abnormal heartbeat or to treat other heart conditions -cisapride -halofantrine -levomethadyl -linezolid -MAOIs like Carbex, Eldepryl, Marplan, Nardil, and Parnate -methylene blue -other medicines for mental depression -phenothiazines like perphenazine, thioridazine and chlorpromazine -pimozide -procarbazine -sparfloxacin -St. John's Wort -ziprasidone This medicine may also interact with the following medications: -cimetidine -tolazamide This list may not describe all possible interactions. Give your health care provider a list of all the medicines, herbs,  non-prescription drugs, or dietary supplements you use. Also tell them if you smoke, drink alcohol, or use illegal drugs. Some items may interact with your medicine. What should I watch for while using this medicine? Visit your doctor or health care professional for regular checks on your progress. It can take several days before you feel the full effect of this medicine. If you have been taking this medicine regularly for some time, do not suddenly stop taking it. You must gradually reduce the dose or you may get severe side effects. Ask your doctor or health care professional for advice. Even after you stop taking this medicine it can still affect your body for several days. Patients and their families should watch out for new or worsening thoughts of suicide or depression. Also watch out for sudden changes in feelings such as feeling anxious, agitated, panicky, irritable, hostile, aggressive, impulsive, severely restless, overly excited and hyperactive, or not being able to sleep. If this happens, especially at the beginning of treatment or after a change in dose, call your health care professional. Dennis Bast may get drowsy or dizzy. Do not drive, use machinery, or do anything that needs mental alertness until you know how this medicine affects you. Do not stand or sit up quickly, especially if you are an older patient. This reduces the risk of dizzy or fainting spells. Alcohol may increase dizziness and drowsiness. Avoid alcoholic drinks. Do not treat yourself for coughs, colds, or allergies without asking your doctor or health care professional for advice. Some ingredients can increase possible side effects. Your mouth may get dry. Chewing sugarless gum or sucking hard candy, and drinking plenty of water may help. Contact your doctor if the problem does not go away or is severe. This medicine may cause dry eyes and blurred vision. If you wear contact lenses you may feel some discomfort. Lubricating drops may  help. See your eye doctor if the problem does not go away or is severe. This medicine can make you more sensitive to the sun. Keep out of the sun. If you cannot avoid being in the sun, wear protective clothing and use sunscreen. Do not use sun lamps or tanning beds/booths. What side effects may I notice from receiving this medicine? Side effects that you should report to your doctor or health care professional as soon as possible: -allergic reactions like skin rash, itching or hives, swelling of the face, lips, or tongue -anxious -breathing problems -changes in vision -confusion -elevated mood, decreased need for sleep, racing thoughts, impulsive behavior -eye pain -fast, irregular heartbeat -feeling faint or lightheaded, falls -feeling agitated, angry, or irritable -fever with increased sweating -hallucination, loss of contact with reality -seizures -stiff muscles -suicidal thoughts or other mood changes -tingling, pain, or numbness in the feet or hands -trouble passing urine or change in the amount of urine -trouble sleeping -unusually weak or tired -vomiting -yellowing of the eyes or skin Side effects that usually do  not require medical attention (report to your doctor or health care professional if they continue or are bothersome): -change in sex drive or performance -change in appetite or weight -constipation -dizziness -dry mouth -nausea -tired -tremors -upset stomach This list may not describe all possible side effects. Call your doctor for medical advice about side effects. You may report side effects to FDA at 1-800-FDA-1088. Where should I keep my medicine? Keep out of the reach of children. Store at room temperature between 15 and 30 degrees C (59 and 86 degrees F). Throw away any unused medicine after the expiration date. NOTE: This sheet is a summary. It may not cover all possible information. If you have questions about this medicine, talk to your doctor,  pharmacist, or health care provider.  2018 Elsevier/Gold Standard (2015-08-20 12:35:05)
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
## 2017-12-04 ENCOUNTER — Ambulatory Visit (INDEPENDENT_AMBULATORY_CARE_PROVIDER_SITE_OTHER): Payer: Medicare Other | Admitting: Psychiatry

## 2017-12-04 ENCOUNTER — Other Ambulatory Visit: Payer: Self-pay

## 2017-12-04 ENCOUNTER — Encounter: Payer: Self-pay | Admitting: Psychiatry

## 2017-12-04 VITALS — BP 145/93 | HR 88 | Temp 99.0°F | Wt 134.2 lb

## 2017-12-04 DIAGNOSIS — F330 Major depressive disorder, recurrent, mild: Secondary | ICD-10-CM | POA: Diagnosis not present

## 2017-12-04 DIAGNOSIS — F5105 Insomnia due to other mental disorder: Secondary | ICD-10-CM

## 2017-12-04 DIAGNOSIS — F17200 Nicotine dependence, unspecified, uncomplicated: Secondary | ICD-10-CM | POA: Diagnosis not present

## 2017-12-04 DIAGNOSIS — F1011 Alcohol abuse, in remission: Secondary | ICD-10-CM | POA: Diagnosis not present

## 2017-12-04 DIAGNOSIS — F410 Panic disorder [episodic paroxysmal anxiety] without agoraphobia: Secondary | ICD-10-CM | POA: Diagnosis not present

## 2017-12-04 MED ORDER — PAROXETINE HCL 20 MG PO TABS: 40.0000 mg | ORAL_TABLET | Freq: Every day | ORAL | 1 refills | Status: DC

## 2017-12-04 MED ORDER — BUPROPION HCL ER (XL) 150 MG PO TB24: 150.0000 mg | ORAL_TABLET | Freq: Every day | ORAL | 1 refills | Status: DC
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
## 2017-12-17 ENCOUNTER — Encounter: Payer: Self-pay | Admitting: Student

## 2017-12-18 ENCOUNTER — Ambulatory Visit: Payer: Medicare Other | Admitting: Anesthesiology

## 2017-12-18 ENCOUNTER — Encounter
Admission: RE | Disposition: A | Discharge status: Home / Self Care | Payer: Self-pay | Source: Ambulatory Visit | Attending: Gastroenterology

## 2017-12-18 ENCOUNTER — Encounter: Payer: Self-pay | Admitting: Anesthesiology

## 2017-12-18 ENCOUNTER — Ambulatory Visit
Admission: RE | Admit: 2017-12-18 | Discharge: 2017-12-18 | Disposition: A | Discharge status: Home / Self Care | Payer: Medicare Other | Source: Ambulatory Visit | Attending: Gastroenterology | Admitting: Gastroenterology

## 2017-12-18 DIAGNOSIS — M069 Rheumatoid arthritis, unspecified: Secondary | ICD-10-CM | POA: Insufficient documentation

## 2017-12-18 DIAGNOSIS — Z86010 Personal history of colonic polyps: Secondary | ICD-10-CM | POA: Diagnosis not present

## 2017-12-18 DIAGNOSIS — Z79899 Other long term (current) drug therapy: Secondary | ICD-10-CM | POA: Diagnosis not present

## 2017-12-18 DIAGNOSIS — H409 Unspecified glaucoma: Secondary | ICD-10-CM | POA: Insufficient documentation

## 2017-12-18 DIAGNOSIS — I10 Essential (primary) hypertension: Secondary | ICD-10-CM | POA: Insufficient documentation

## 2017-12-18 DIAGNOSIS — F17200 Nicotine dependence, unspecified, uncomplicated: Secondary | ICD-10-CM | POA: Insufficient documentation

## 2017-12-18 DIAGNOSIS — J449 Chronic obstructive pulmonary disease, unspecified: Secondary | ICD-10-CM | POA: Diagnosis not present

## 2017-12-18 DIAGNOSIS — D123 Benign neoplasm of transverse colon: Secondary | ICD-10-CM | POA: Diagnosis not present

## 2017-12-18 DIAGNOSIS — K5730 Diverticulosis of large intestine without perforation or abscess without bleeding: Secondary | ICD-10-CM | POA: Diagnosis not present

## 2017-12-18 DIAGNOSIS — Z1211 Encounter for screening for malignant neoplasm of colon: Secondary | ICD-10-CM | POA: Diagnosis not present

## 2017-12-18 DIAGNOSIS — F329 Major depressive disorder, single episode, unspecified: Secondary | ICD-10-CM | POA: Insufficient documentation

## 2017-12-18 HISTORY — DX: Alcohol dependence, in remission: F10.21

## 2017-12-18 HISTORY — PX: COLONOSCOPY WITH PROPOFOL: SHX5780

## 2017-12-18 SURGERY — COLONOSCOPY WITH PROPOFOL
Anesthesia: General

## 2017-12-18 MED ORDER — SODIUM CHLORIDE 0.9 % IV SOLN
INTRAVENOUS | Status: DC
  Administered 2017-12-18: 1000 mL via INTRAVENOUS

## 2017-12-18 MED ORDER — LIDOCAINE HCL (PF) 2 % IJ SOLN
INTRAMUSCULAR | Status: AC
  Filled 2017-12-18: qty 10

## 2017-12-18 MED ORDER — LIDOCAINE HCL (PF) 1 % IJ SOLN
INTRAMUSCULAR | Status: AC
  Administered 2017-12-18: 0.3 mL
  Filled 2017-12-18: qty 2

## 2017-12-18 MED ORDER — PHENYLEPHRINE HCL 10 MG/ML IJ SOLN
INTRAMUSCULAR | Status: DC | PRN
  Administered 2017-12-18 (×4): 100 ug via INTRAVENOUS

## 2017-12-18 MED ORDER — LIDOCAINE HCL (CARDIAC) PF 100 MG/5ML IV SOSY
PREFILLED_SYRINGE | INTRAVENOUS | Status: DC | PRN
  Administered 2017-12-18: 50 mg via INTRAVENOUS

## 2017-12-18 MED ORDER — PROPOFOL 500 MG/50ML IV EMUL
INTRAVENOUS | Status: DC | PRN
  Administered 2017-12-18: 135 ug/kg/min via INTRAVENOUS

## 2017-12-18 MED ORDER — PROPOFOL 10 MG/ML IV BOLUS
INTRAVENOUS | Status: DC | PRN
  Administered 2017-12-18: 80 mg via INTRAVENOUS

## 2017-12-18 MED ORDER — PROPOFOL 500 MG/50ML IV EMUL
INTRAVENOUS | Status: AC
  Filled 2017-12-18: qty 50

## 2017-12-18 NOTE — Transfer of Care (Signed)
Immediate Anesthesia Transfer of Care Note  Patient: Nikeria Kalman  Procedure(s) Performed: COLONOSCOPY WITH PROPOFOL (N/A )  Patient Location: PACU and Endoscopy Unit  Anesthesia Type:General  Level of Consciousness: drowsy  Airway & Oxygen Therapy: Patient Spontanous Breathing and Patient connected to nasal cannula oxygen  Post-op Assessment: Report given to RN and Post -op Vital signs reviewed and stable  Post vital signs: Reviewed and stable  Last Vitals:  Vitals Value Taken Time  BP    Temp    Pulse    Resp    SpO2      Last Pain:  Vitals:   12/18/17 1214  TempSrc: (P) Tympanic  PainSc: (P) 0-No pain         Complications: No apparent anesthesia complications
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
## 2017-12-18 NOTE — Op Note (Signed)
[facility] Gastroenterology Patient Name: Summer Marshall Procedure Date: 12/18/2017 12:37 PM MRN: [id number] Account #: [id number] Date of Birth: July 20, 1950 Admit Type: Outpatient Age: 67 Room: [facility] ENDO ROOM 2 Gender: Female Note Status: Finalized Procedure:            Colonoscopy Indications:          Personal history of colonic polyps Providers:            Lollie Sails, MD Referring MD:         Youlanda Roys. Lovie Macadamia, MD (Referring MD) Medicines:            Monitored Anesthesia Care Complications:        No immediate complications. Procedure:            Pre-Anesthesia Assessment:                       - ASA Grade Assessment: III - A patient with severe                        systemic disease.                       After obtaining informed consent, the colonoscope was                        passed under direct vision. Throughout the procedure,                        the patient's blood pressure, pulse, and oxygen                        saturations were monitored continuously. The was                        introduced through the anus and advanced to the the                        cecum, identified by appendiceal orifice and ileocecal                        valve. The colonoscopy was performed without                        difficulty. The patient tolerated the procedure well.                        The quality of the bowel preparation was good except                        the ascending colon was poor. Findings:      Multiple small-mouthed diverticula were found in the sigmoid colon and       descending colon.      A 4 mm polyp was found in the transverse colon. The polyp was sessile.       The polyp was removed with a cold biopsy forceps. Resection and       retrieval were complete.      The exam was otherwise normal throughout the examined colon. There was a       small amount of barotrauma limited to the mucosal surface in the  proximal ascending colon.      The retroflexed view of the distal rectum and anal verge was normal and       showed no anal or rectal abnormalities.      The digital rectal exam was normal. Impression:           - Diverticulosis in the sigmoid colon and in the                        descending colon.                       - One 4 mm polyp in the transverse colon, removed with                        a cold biopsy forceps. Resected and retrieved.                       - The distal rectum and anal verge are normal on                        retroflexion view. Recommendation:       - Discharge patient to home.                       - Telephone GI clinic for pathology results in 1 week. Procedure Code(s):    --- Professional ---                       [phone], Colonoscopy, flexible; with biopsy, single or                        multiple Diagnosis Code(s):    --- Professional ---                       D12.3, Benign neoplasm of transverse colon (hepatic                        flexure or splenic flexure)                       Z86.010, Personal history of colonic polyps                       K57.30, Diverticulosis of large intestine without                        perforation or abscess without bleeding CPT copyright 2017 American Medical Association. All rights reserved. The codes documented in this report are preliminary and upon coder review may  be revised to meet current compliance requirements. Lollie Sails, MD 12/18/2017 1:25:56 PM This report has been signed electronically. Number of Addenda: 0 Note Initiated On: 12/18/2017 12:37 PM Scope Withdrawal Time: 0 hours 7 minutes 22 seconds  Total Procedure Duration: 0 hours 21 minutes 57 seconds       [facility]

## 2017-12-18 NOTE — Anesthesia Post-op Follow-up Note (Signed)
Anesthesia QCDR form completed.        

## 2017-12-19 LAB — SURGICAL PATHOLOGY

## 2017-12-19 NOTE — Anesthesia Postprocedure Evaluation (Signed)
Anesthesia Post Note  Patient: Kimberlly Norgard  Procedure(s) Performed: COLONOSCOPY WITH PROPOFOL (N/A )  Patient location during evaluation: Endoscopy Anesthesia Type: General Level of consciousness: awake and alert Pain management: pain level controlled Vital Signs Assessment: post-procedure vital signs reviewed and stable Respiratory status: spontaneous breathing, nonlabored ventilation, respiratory function stable and patient connected to nasal cannula oxygen Cardiovascular status: blood pressure returned to baseline and stable Postop Assessment: no apparent nausea or vomiting Anesthetic complications: no     Last Vitals:  Vitals:   12/18/17 1346 12/18/17 1356  BP: 125/80 122/80  Pulse: (!) 115 90  Resp: 13 13  Temp:    SpO2: (!) 23% (!) 37%    Last Pain:  Vitals:   12/19/17 0834  TempSrc:   PainSc: 0-No pain                 Precious Haws Piscitello

## 2018-03-05 ENCOUNTER — Ambulatory Visit: Payer: Medicare Other | Admitting: Psychiatry

## 2018-03-11 ENCOUNTER — Telehealth: Payer: Self-pay

## 2018-03-11 DIAGNOSIS — F410 Panic disorder [episodic paroxysmal anxiety] without agoraphobia: Secondary | ICD-10-CM

## 2018-03-11 DIAGNOSIS — F330 Major depressive disorder, recurrent, mild: Secondary | ICD-10-CM

## 2018-03-11 MED ORDER — PAROXETINE HCL 20 MG PO TABS: 40.0000 mg | ORAL_TABLET | Freq: Every day | ORAL | 0 refills | Status: DC

## 2018-03-11 MED ORDER — BUPROPION HCL ER (XL) 150 MG PO TB24: 150.0000 mg | ORAL_TABLET | Freq: Every day | ORAL | 0 refills | Status: DC

## 2018-03-11 MED ORDER — DOXEPIN HCL 10 MG PO CAPS: 10.0000 mg | ORAL_CAPSULE | Freq: Every day | ORAL | 0 refills | Status: DC

## 2018-03-11 NOTE — Telephone Encounter (Signed)
pt needs enough medications to get to her next appt on  04-02-18   Disp Refills Start End   buPROPion (WELLBUTRIN XL) 150 MG 24 hr tablet 90 tablet 1 12/04/2017    Sig - Route: Take 1 tablet (150 mg total) by mouth daily. - Oral   Sent to pharmacy as: buPROPion (WELLBUTRIN XL) 150 MG 24 hr tablet   E-Prescribing Status: Receipt confirmed by pharmacy (12/04/2017 2:01 PM EDT)     Disp Refills Start End   doxepin (SINEQUAN) 10 MG capsule 180 capsule 1 11/06/2017    Sig - Route: Take 1-2 capsules (10-20 mg total) by mouth at bedtime. - Oral   Sent to pharmacy as: doxepin (SINEQUAN) 10 MG capsule   E-Prescribing Status: Receipt confirmed by pharmacy (11/06/2017 1:44 PM EDT)     Disp Refills Start End   PARoxetine (PAXIL) 20 MG tablet 180 tablet 1 12/04/2017    Sig - Route: Take 2 tablets (40 mg total) by mouth daily. - Oral   Sent to pharmacy as: PARoxetine (PAXIL) 20 MG tablet   E-Prescribing Status: Receipt confirmed by pharmacy (12/04/2017 2:01 PM EDT)

## 2018-03-11 NOTE — Telephone Encounter (Signed)
Sent rx for 30 days. Please let pt know. Thanks

## 2018-03-12 ENCOUNTER — Ambulatory Visit: Payer: Medicare Other | Admitting: Psychiatry

## 2018-04-02 ENCOUNTER — Encounter: Payer: Self-pay | Admitting: Psychiatry

## 2018-04-02 ENCOUNTER — Ambulatory Visit (INDEPENDENT_AMBULATORY_CARE_PROVIDER_SITE_OTHER): Payer: Medicare Other | Admitting: Psychiatry

## 2018-04-02 VITALS — BP 160/98 | HR 101 | Ht 61.0 in | Wt 133.0 lb

## 2018-04-02 DIAGNOSIS — F330 Major depressive disorder, recurrent, mild: Secondary | ICD-10-CM

## 2018-04-02 DIAGNOSIS — F5105 Insomnia due to other mental disorder: Secondary | ICD-10-CM

## 2018-04-02 DIAGNOSIS — F410 Panic disorder [episodic paroxysmal anxiety] without agoraphobia: Secondary | ICD-10-CM

## 2018-04-02 DIAGNOSIS — F17200 Nicotine dependence, unspecified, uncomplicated: Secondary | ICD-10-CM | POA: Diagnosis not present

## 2018-04-02 DIAGNOSIS — F1011 Alcohol abuse, in remission: Secondary | ICD-10-CM

## 2018-04-02 MED ORDER — PAROXETINE HCL 40 MG PO TABS: 40.0000 mg | ORAL_TABLET | Freq: Every day | ORAL | 1 refills | Status: DC

## 2018-04-02 MED ORDER — PAROXETINE HCL 10 MG PO TABS: 10.0000 mg | ORAL_TABLET | Freq: Every day | ORAL | 1 refills | Status: DC

## 2018-04-02 MED ORDER — BUPROPION HCL ER (XL) 150 MG PO TB24: 150.0000 mg | ORAL_TABLET | Freq: Every day | ORAL | 1 refills | Status: DC

## 2018-04-02 MED ORDER — HYDROXYZINE HCL 25 MG PO TABS: 25.0000 mg | ORAL_TABLET | Freq: Two times a day (BID) | ORAL | 1 refills | Status: DC | PRN

## 2018-04-02 MED ORDER — DOXEPIN HCL 10 MG PO CAPS: 10.0000 mg | ORAL_CAPSULE | Freq: Every evening | ORAL | 1 refills | Status: DC | PRN

## 2018-04-02 NOTE — Patient Instructions (Signed)
Hydroxyzine capsules or tablets What is this medicine? HYDROXYZINE (hye DROX i zeen) is an antihistamine. This medicine is used to treat allergy symptoms. It is also used to treat anxiety and tension. This medicine can be used with other medicines to induce sleep before surgery. This medicine may be used for other purposes; ask your health care provider or pharmacist if you have questions. COMMON BRAND NAME(S): ANX, Atarax, Rezine, Vistaril What should I tell my health care provider before I take this medicine? They need to know if you have any of these conditions: -glaucoma -heart disease -history of irregular heartbeat -kidney disease -liver disease -lung or breathing disease, like asthma -stomach or intestine problems -thyroid disease -trouble passing urine -an unusual or allergic reaction to hydroxyzine, cetirizine, other medicines, foods, dyes or preservatives -pregnant or trying to get pregnant -breast-feeding How should I use this medicine? Take this medicine by mouth with a full glass of water. Follow the directions on the prescription label. You may take this medicine with food or on an empty stomach. Take your medicine at regular intervals. Do not take your medicine more often than directed. Talk to your pediatrician regarding the use of this medicine in children. Special care may be needed. While this drug may be prescribed for children as young as 6 years of age for selected conditions, precautions do apply. Patients over 65 years old may have a stronger reaction and need a smaller dose. Overdosage: If you think you have taken too much of this medicine contact a poison control center or emergency room at once. NOTE: This medicine is only for you. Do not share this medicine with others. What if I miss a dose? If you miss a dose, take it as soon as you can. If it is almost time for your next dose, take only that dose. Do not take double or extra doses. What may interact with this  medicine? Do not take this medicine with any of the following medications: -cisapride -dofetilide -dronedarone -pimozide -thioridazine This medicine may also interact with the following medications: -alcohol -antihistamines for allergy, cough, and cold -atropine -barbiturate medicines for sleep or seizures, like phenobarbital -certain antibiotics like erythromycin or clarithromycin -certain medicines for anxiety or sleep -certain medicines for bladder problems like oxybutynin, tolterodine -certain medicines for depression or psychotic disturbances -certain medicines for irregular heart beat -certain medicines for Parkinson's disease like benztropine, trihexyphenidyl -certain medicines for seizures like phenobarbital, primidone -certain medicines for stomach problems like dicyclomine, hyoscyamine -certain medicines for travel sickness like scopolamine -ipratropium -narcotic medicines for pain -other medicines that prolong the QT interval (an abnormal heart rhythm) This list may not describe all possible interactions. Give your health care provider a list of all the medicines, herbs, non-prescription drugs, or dietary supplements you use. Also tell them if you smoke, drink alcohol, or use illegal drugs. Some items may interact with your medicine. What should I watch for while using this medicine? Tell your doctor or health care professional if your symptoms do not improve. You may get drowsy or dizzy. Do not drive, use machinery, or do anything that needs mental alertness until you know how this medicine affects you. Do not stand or sit up quickly, especially if you are an older patient. This reduces the risk of dizzy or fainting spells. Alcohol may interfere with the effect of this medicine. Avoid alcoholic drinks. Your mouth may get dry. Chewing sugarless gum or sucking hard candy, and drinking plenty of water may help. Contact your doctor if   the problem does not go away or is  severe. This medicine may cause dry eyes and blurred vision. If you wear contact lenses you may feel some discomfort. Lubricating drops may help. See your eye doctor if the problem does not go away or is severe. If you are receiving skin tests for allergies, tell your doctor you are using this medicine. What side effects may I notice from receiving this medicine? Side effects that you should report to your doctor or health care professional as soon as possible: -allergic reactions like skin rash, itching or hives, swelling of the face, lips, or tongue -changes in vision -confusion -fast, irregular heartbeat -seizures -tremor -trouble passing urine or change in the amount of urine Side effects that usually do not require medical attention (report to your doctor or health care professional if they continue or are bothersome): -constipation -drowsiness -dry mouth -headache -tiredness This list may not describe all possible side effects. Call your doctor for medical advice about side effects. You may report side effects to FDA at 1-800-FDA-1088. Where should I keep my medicine? Keep out of the reach of children. Store at room temperature between 15 and 30 degrees C (59 and 86 degrees F). Keep container tightly closed. Throw away any unused medicine after the expiration date. NOTE: This sheet is a summary. It may not cover all possible information. If you have questions about this medicine, talk to your doctor, pharmacist, or health care provider.  2019 Elsevier/Gold Standard (2017-10-01 13:25:13)  
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
## 2018-04-30 ENCOUNTER — Other Ambulatory Visit: Payer: Self-pay

## 2018-04-30 ENCOUNTER — Encounter: Payer: Self-pay | Admitting: Psychiatry

## 2018-04-30 ENCOUNTER — Ambulatory Visit (INDEPENDENT_AMBULATORY_CARE_PROVIDER_SITE_OTHER): Payer: Medicare Other | Admitting: Psychiatry

## 2018-04-30 VITALS — BP 140/90 | HR 94 | Temp 98.8°F | Wt 131.0 lb

## 2018-04-30 DIAGNOSIS — F5105 Insomnia due to other mental disorder: Secondary | ICD-10-CM

## 2018-04-30 DIAGNOSIS — R030 Elevated blood-pressure reading, without diagnosis of hypertension: Secondary | ICD-10-CM

## 2018-04-30 DIAGNOSIS — F330 Major depressive disorder, recurrent, mild: Secondary | ICD-10-CM

## 2018-04-30 DIAGNOSIS — F17200 Nicotine dependence, unspecified, uncomplicated: Secondary | ICD-10-CM

## 2018-04-30 DIAGNOSIS — F410 Panic disorder [episodic paroxysmal anxiety] without agoraphobia: Secondary | ICD-10-CM | POA: Diagnosis not present

## 2018-04-30 DIAGNOSIS — F1011 Alcohol abuse, in remission: Secondary | ICD-10-CM

## 2018-04-30 MED ORDER — DOXEPIN HCL 10 MG PO CAPS: 10.0000 mg | ORAL_CAPSULE | Freq: Every day | ORAL | 2 refills | Status: DC
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
## 2018-05-20 ENCOUNTER — Ambulatory Visit: Payer: Medicare Other | Admitting: Licensed Clinical Social Worker

## 2018-05-30 ENCOUNTER — Encounter: Payer: Self-pay | Admitting: Psychiatry

## 2018-05-30 ENCOUNTER — Other Ambulatory Visit: Payer: Self-pay

## 2018-05-30 ENCOUNTER — Ambulatory Visit (INDEPENDENT_AMBULATORY_CARE_PROVIDER_SITE_OTHER): Payer: Medicare Other | Admitting: Psychiatry

## 2018-05-30 VITALS — BP 169/94 | HR 92 | Temp 97.9°F | Wt 132.8 lb

## 2018-05-30 DIAGNOSIS — F17200 Nicotine dependence, unspecified, uncomplicated: Secondary | ICD-10-CM | POA: Diagnosis not present

## 2018-05-30 DIAGNOSIS — F330 Major depressive disorder, recurrent, mild: Secondary | ICD-10-CM | POA: Diagnosis not present

## 2018-05-30 DIAGNOSIS — F410 Panic disorder [episodic paroxysmal anxiety] without agoraphobia: Secondary | ICD-10-CM | POA: Diagnosis not present
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
## 2018-05-31 ENCOUNTER — Telehealth: Payer: Self-pay

## 2018-05-31 ENCOUNTER — Telehealth (HOSPITAL_COMMUNITY): Payer: Self-pay

## 2018-05-31 NOTE — Telephone Encounter (Signed)
Medication management - Telephone call with patient after she left another message today now stating the [employer] she worked at also needed a copy of her letter to return to work.  Informed this was faxed to the number she provided, [phone].  Copy of fax sent to scan and patient was appreciative of this being sent as requested.

## 2018-05-31 NOTE — Telephone Encounter (Signed)
Medication management - Telephone call with patient to follow up on her request to send in paperwork to [facility] from Dr. Shea Evans that she can now return to work.  Met with Dr. Shea Evans who agreed patient can return to work on or after March 1st with no restrictions.  Agreed with patient to call her leave of absence office to make sure the letter was faxed to the correct place and will call patient back once sent. Medication management - Faxed letter from Dr. Shea Evans to [facility] [employer] of Absence office to inform patient could return to work March 1st with [place].  Dr. Shea Evans reviewed and signed letter and faxed form to [phone].  Form was confirmed received.  Called patient to inform the letter to return to work was sent to [employer] and confirmed received today.

## 2018-06-07 DIAGNOSIS — R030 Elevated blood-pressure reading, without diagnosis of hypertension: Secondary | ICD-10-CM | POA: Insufficient documentation

## 2018-06-14 ENCOUNTER — Other Ambulatory Visit: Payer: Self-pay | Admitting: Family Medicine

## 2018-06-14 DIAGNOSIS — N6489 Other specified disorders of breast: Secondary | ICD-10-CM

## 2018-06-14 DIAGNOSIS — Z1231 Encounter for screening mammogram for malignant neoplasm of breast: Secondary | ICD-10-CM

## 2018-06-17 ENCOUNTER — Ambulatory Visit: Payer: Medicare Other | Admitting: Licensed Clinical Social Worker

## 2018-06-21 ENCOUNTER — Other Ambulatory Visit: Payer: Medicare Other

## 2018-06-25 ENCOUNTER — Institutional Professional Consult (permissible substitution): Payer: Medicaid Other | Admitting: Psychiatry

## 2018-07-16 ENCOUNTER — Other Ambulatory Visit: Payer: Self-pay

## 2018-08-05 ENCOUNTER — Other Ambulatory Visit: Payer: Self-pay

## 2018-08-05 ENCOUNTER — Ambulatory Visit: Payer: Medicare Other

## 2018-08-23 ENCOUNTER — Other Ambulatory Visit: Payer: Self-pay | Admitting: Psychiatry

## 2018-09-16 ENCOUNTER — Other Ambulatory Visit: Payer: Self-pay | Admitting: Psychiatry

## 2019-01-14 ENCOUNTER — Other Ambulatory Visit: Payer: Self-pay | Admitting: Psychiatry

## 2019-01-28 ENCOUNTER — Other Ambulatory Visit: Payer: Self-pay | Admitting: *Deleted

## 2019-01-28 DIAGNOSIS — Z20822 Contact with and (suspected) exposure to covid-19: Secondary | ICD-10-CM

## 2019-01-30 ENCOUNTER — Other Ambulatory Visit: Payer: Self-pay | Admitting: Psychiatry

## 2019-01-30 DIAGNOSIS — F330 Major depressive disorder, recurrent, mild: Secondary | ICD-10-CM

## 2019-01-30 DIAGNOSIS — F410 Panic disorder [episodic paroxysmal anxiety] without agoraphobia: Secondary | ICD-10-CM

## 2019-01-30 LAB — NOVEL CORONAVIRUS, NAA: SARS-CoV-2, NAA: NOT DETECTED

## 2019-01-31 MED ORDER — PAROXETINE HCL 40 MG PO TABS: ORAL_TABLET | ORAL | 0 refills | Status: DC

## 2019-01-31 MED ORDER — PAROXETINE HCL 10 MG PO TABS: ORAL_TABLET | ORAL | 0 refills | Status: DC

## 2019-01-31 NOTE — Telephone Encounter (Signed)
Will refill paxil for 30 days only - patient needs appointment

## 2019-02-13 ENCOUNTER — Other Ambulatory Visit: Payer: Self-pay | Admitting: Psychiatry

## 2019-02-13 DIAGNOSIS — F330 Major depressive disorder, recurrent, mild: Secondary | ICD-10-CM

## 2019-02-13 DIAGNOSIS — F410 Panic disorder [episodic paroxysmal anxiety] without agoraphobia: Secondary | ICD-10-CM

## 2019-02-19 ENCOUNTER — Telehealth: Payer: Self-pay | Admitting: *Deleted

## 2019-02-19 NOTE — Telephone Encounter (Signed)
Patient called and was given NEGATIVE COVID results . 

## 2019-03-01 ENCOUNTER — Other Ambulatory Visit: Payer: Self-pay | Admitting: Psychiatry

## 2019-03-18 ENCOUNTER — Other Ambulatory Visit: Payer: Self-pay | Admitting: Psychiatry

## 2019-03-18 NOTE — Telephone Encounter (Signed)
Patient needs appointment.

## 2019-08-11 ENCOUNTER — Other Ambulatory Visit: Payer: Self-pay | Admitting: Family Medicine

## 2019-08-11 DIAGNOSIS — N6489 Other specified disorders of breast: Secondary | ICD-10-CM

## 2019-08-18 ENCOUNTER — Other Ambulatory Visit: Payer: Medicare Other

## 2019-08-22 ENCOUNTER — Ambulatory Visit
Admission: RE | Admit: 2019-08-22 | Discharge: 2019-08-22 | Disposition: A | Discharge status: Home / Self Care | Payer: Medicare Other | Source: Ambulatory Visit | Attending: Family Medicine | Admitting: Family Medicine

## 2019-08-22 DIAGNOSIS — N6489 Other specified disorders of breast: Secondary | ICD-10-CM | POA: Diagnosis not present

## 2020-06-26 ENCOUNTER — Other Ambulatory Visit: Payer: Self-pay | Admitting: Psychiatry

## 2020-06-26 DIAGNOSIS — F410 Panic disorder [episodic paroxysmal anxiety] without agoraphobia: Secondary | ICD-10-CM

## 2020-06-26 DIAGNOSIS — F330 Major depressive disorder, recurrent, mild: Secondary | ICD-10-CM

## 2021-10-30 IMAGING — MG DIGITAL DIAGNOSTIC BILAT W/ TOMO W/ CAD
8 series · 8 of 24 positions shown · non-contrast
Comparison: Previous exam(s).

CLINICAL DATA: Follow-up of left breast asymmetries

EXAM:
DIGITAL DIAGNOSTIC BILATERAL MAMMOGRAM WITH TOMO
ULTRASOUND LEFT BREAST

[L MLO synth-2D]
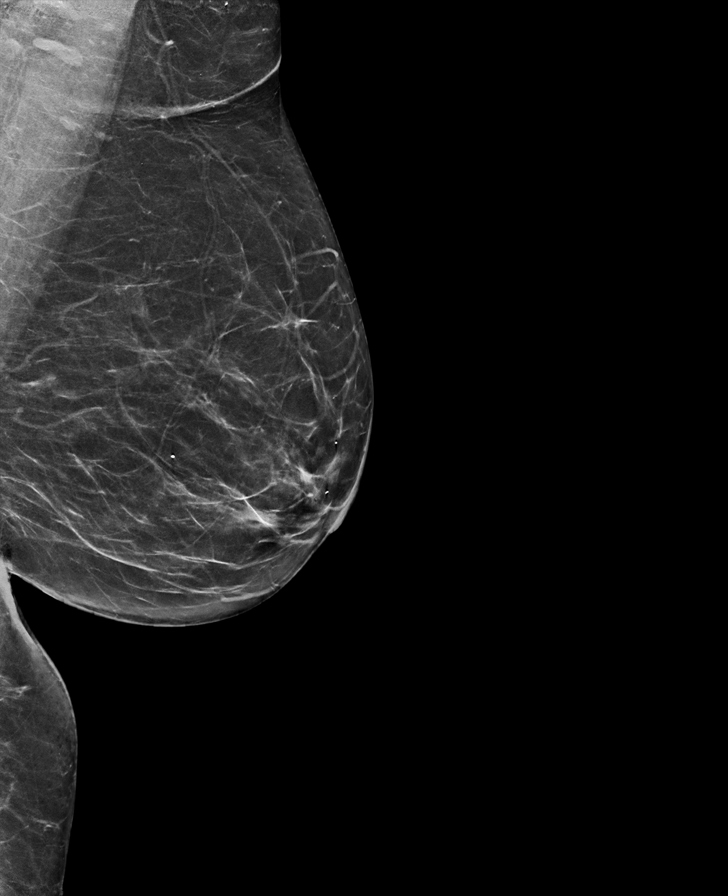

[R MLO synth-2D]
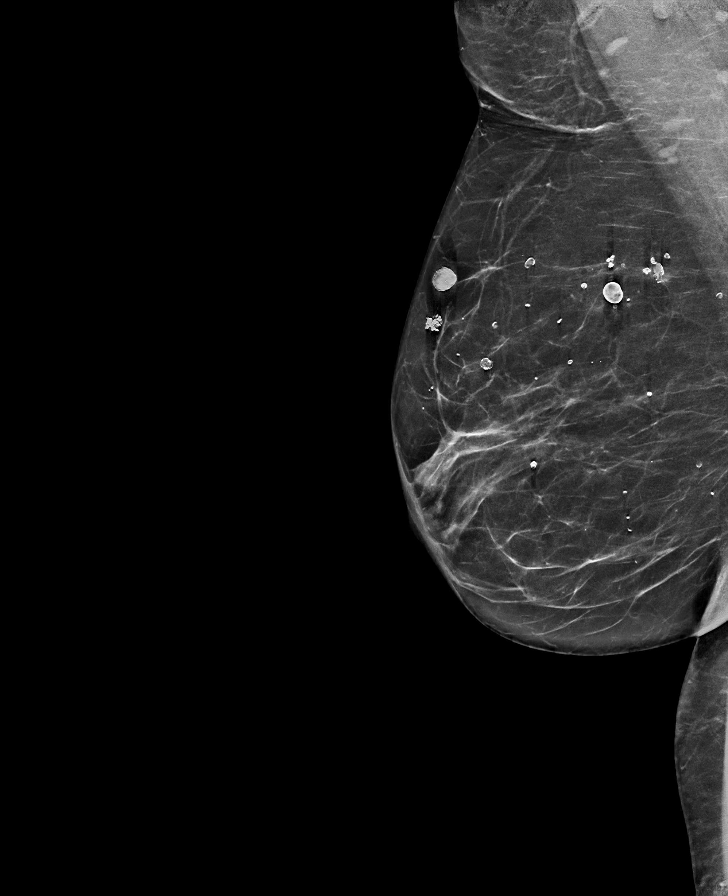

[L CC synth-2D]
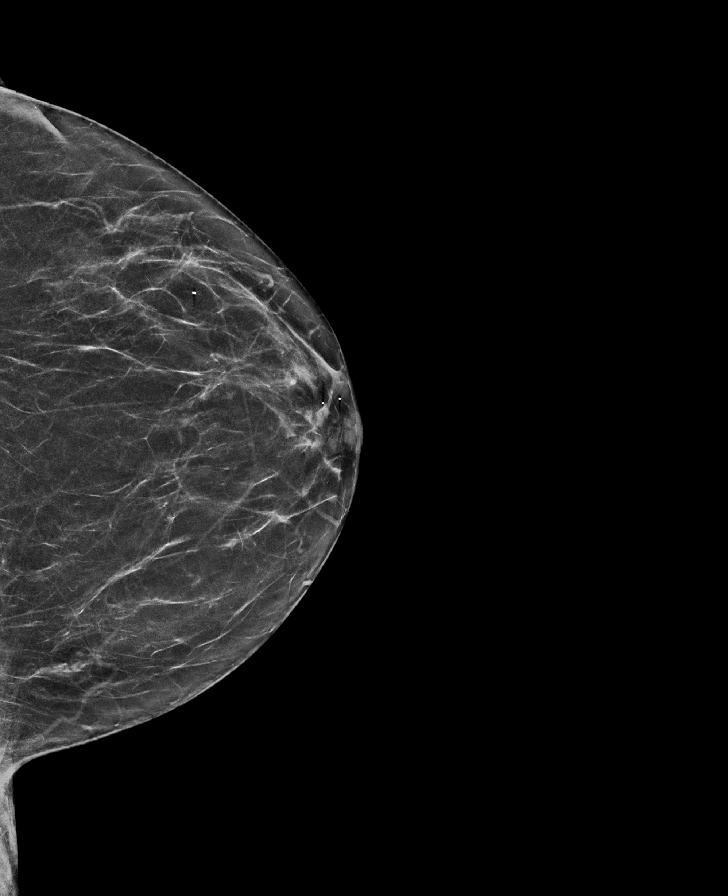

[R CC synth-2D]
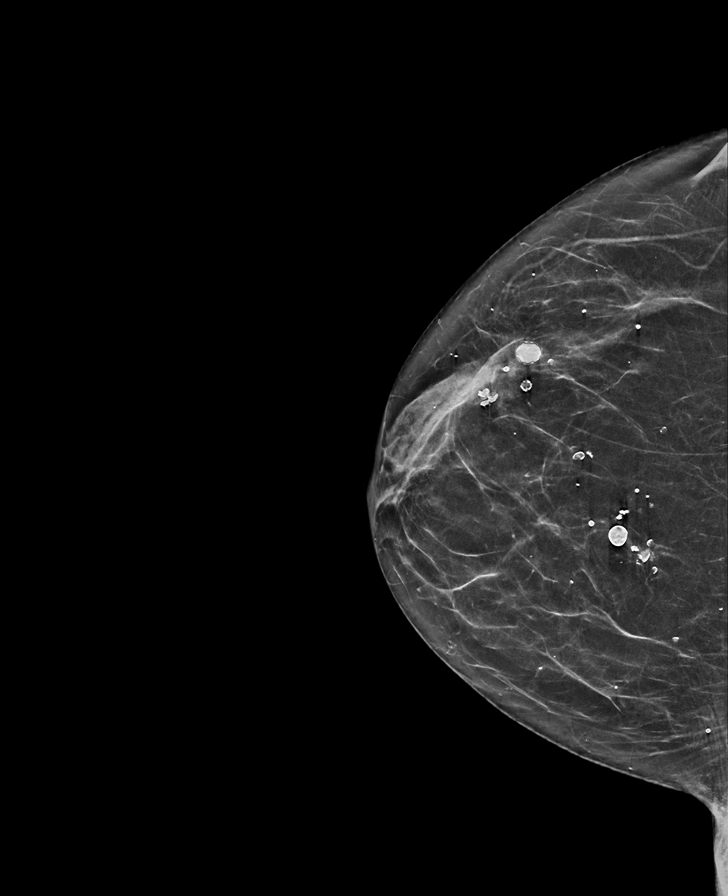

[L MLO tomo · tomo slice 33/66.0]
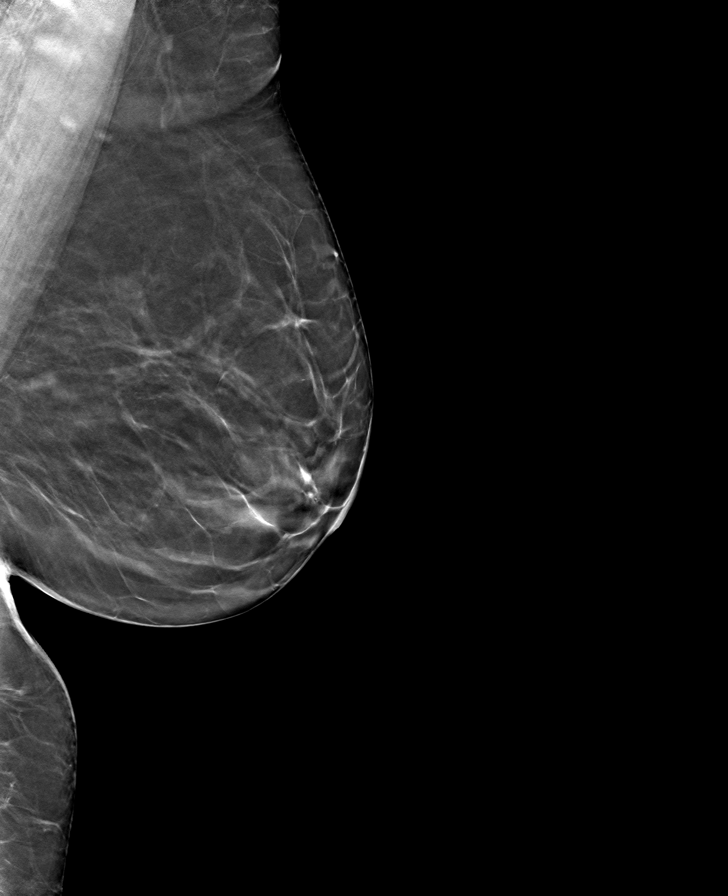

[R MLO tomo · tomo slice 34/67.0]
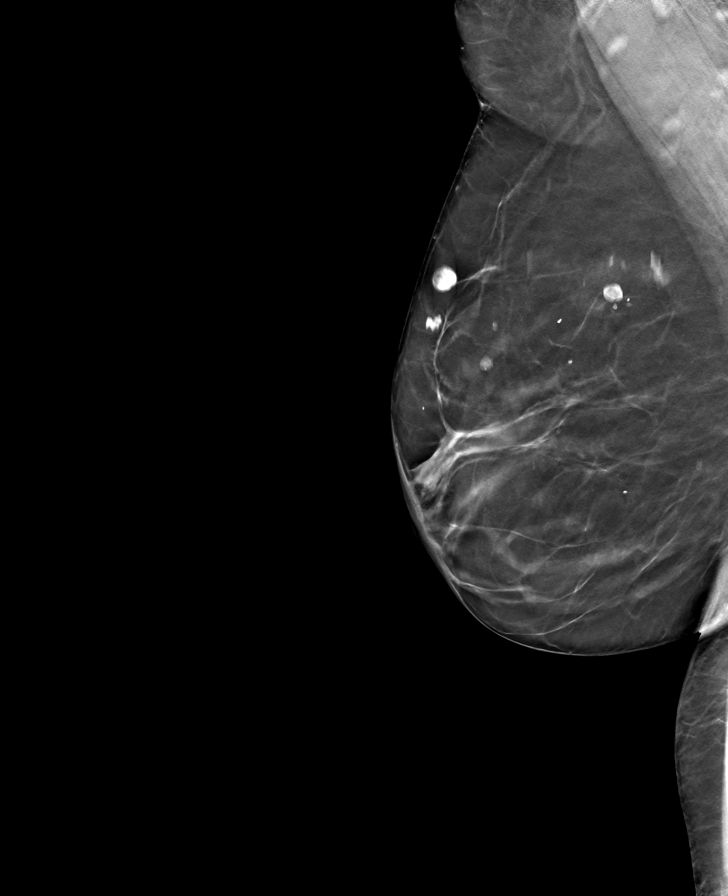

[R CC tomo · tomo slice 30/59.0]
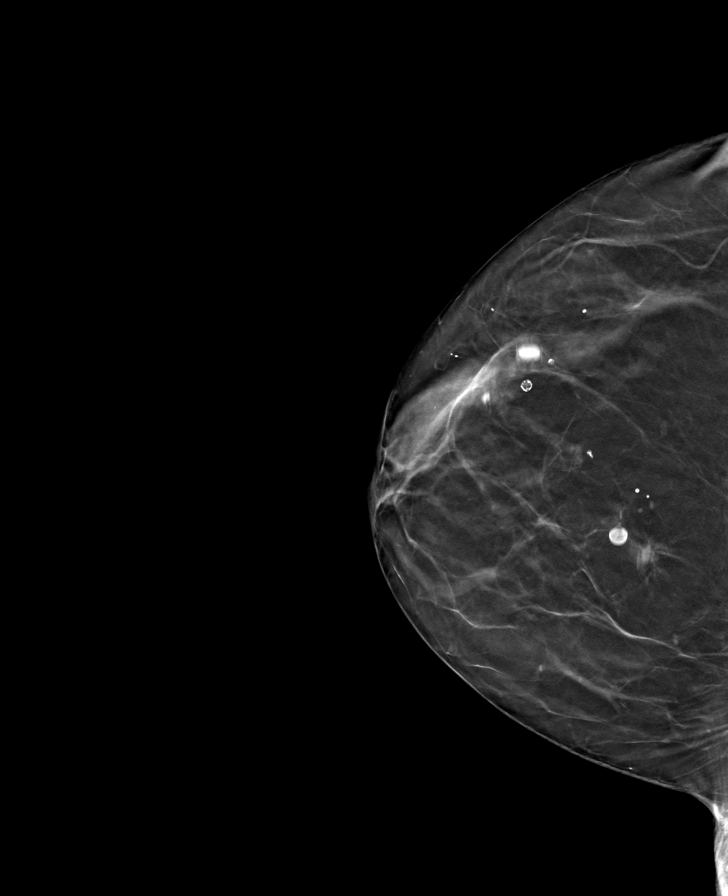

[L CC tomo · tomo slice 31/60.0]
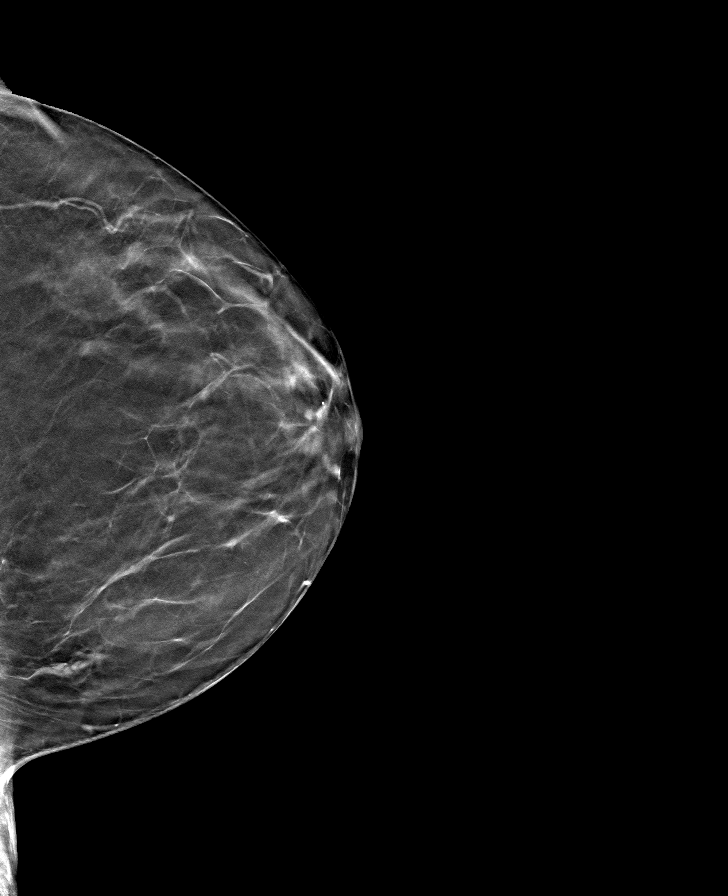

[8 of 24 positions shown; findings below may reference images not displayed]

ACR Breast Density Category b: There are scattered areas of
fibroglandular density.
FINDINGS: The asymmetry in the lateral left breast has resolved. The asymmetry
in the medial left breast persists. No other suspicious findings are
seen mammographically in either breast.

On physical exam, no suspicious lumps are identified.

Targeted ultrasound is performed, showing a cluster of cysts in the
left breast at 10 o'clock, 5 cm from the nipple, unchanged.
IMPRESSION: Cluster of cysts in the left breast at 10 o'clock. This may
correlate with the medial left asymmetry which is stable
mammographically. No mammographic or sonographic evidence of
malignancy.

RECOMMENDATION:
Annual screening mammography.

I have discussed the findings and recommendations with the patient.
If applicable, a reminder letter will be sent to the patient
regarding the next appointment.

BI-RADS CATEGORY  2: Benign.

## 2021-12-08 ENCOUNTER — Other Ambulatory Visit: Payer: Self-pay | Admitting: Family Medicine

## 2021-12-08 DIAGNOSIS — Z1231 Encounter for screening mammogram for malignant neoplasm of breast: Secondary | ICD-10-CM

## 2021-12-22 ENCOUNTER — Inpatient Hospital Stay: Payer: Medicare Other | Admitting: Oncology

## 2021-12-22 ENCOUNTER — Inpatient Hospital Stay: Payer: Medicare Other

## 2022-01-04 ENCOUNTER — Inpatient Hospital Stay: Payer: Medicare Other | Attending: Oncology | Admitting: Oncology

## 2022-01-04 ENCOUNTER — Inpatient Hospital Stay: Payer: Medicare Other

## 2022-01-04 ENCOUNTER — Encounter: Payer: Self-pay | Admitting: Oncology

## 2022-01-04 VITALS — BP 173/99 | HR 87 | Temp 98.7°F | Resp 18 | Ht 60.25 in | Wt 110.0 lb

## 2022-01-04 DIAGNOSIS — D72818 Other decreased white blood cell count: Secondary | ICD-10-CM

## 2022-01-04 DIAGNOSIS — Z90710 Acquired absence of both cervix and uterus: Secondary | ICD-10-CM | POA: Diagnosis not present

## 2022-01-04 DIAGNOSIS — I10 Essential (primary) hypertension: Secondary | ICD-10-CM | POA: Insufficient documentation

## 2022-01-04 DIAGNOSIS — D7589 Other specified diseases of blood and blood-forming organs: Secondary | ICD-10-CM | POA: Diagnosis not present

## 2022-01-04 DIAGNOSIS — F17210 Nicotine dependence, cigarettes, uncomplicated: Secondary | ICD-10-CM | POA: Insufficient documentation

## 2022-01-04 DIAGNOSIS — D72819 Decreased white blood cell count, unspecified: Secondary | ICD-10-CM | POA: Insufficient documentation

## 2022-01-04 DIAGNOSIS — D729 Disorder of white blood cells, unspecified: Secondary | ICD-10-CM

## 2022-01-04 LAB — COMPREHENSIVE METABOLIC PANEL
ALT: 12 U/L (ref 0–44)
AST: 15 U/L (ref 15–41)
Albumin: 4.3 g/dL (ref 3.5–5.0)
Alkaline Phosphatase: 65 U/L (ref 38–126)
Anion gap: 5 (ref 5–15)
BUN: 13 mg/dL (ref 8–23)
CO2: 27 mmol/L (ref 22–32)
Calcium: 9.2 mg/dL (ref 8.9–10.3)
Chloride: 106 mmol/L (ref 98–111)
Creatinine, Ser: 0.87 mg/dL (ref 0.44–1.00)
GFR, Estimated: >60 mL/min (ref >60)
Glucose, Bld: 93 mg/dL (ref 70–99)
Potassium: 3.5 mmol/L (ref 3.5–5.1)
Sodium: 138 mmol/L (ref 135–145)
Total Bilirubin: 0.4 mg/dL (ref 0.3–1.2)
Total Protein: 7.9 g/dL (ref 6.5–8.1)

## 2022-01-04 LAB — RETIC PANEL
Immature Retic Fract: 11.3 % (ref 2.3–15.9)
RBC.: 3.58 MIL/uL — ABNORMAL LOW (ref 3.87–5.11)
Retic Count, Absolute: 103.8 10*3/uL (ref 19.0–186.0)
Retic Ct Pct: 2.9 % (ref 0.4–3.1)
Reticulocyte Hemoglobin: 36.6 pg (ref >27.9)

## 2022-01-04 LAB — CBC WITH DIFFERENTIAL/PLATELET
Abs Immature Granulocytes: 0.01 10*3/uL (ref 0.00–0.07)
Basophils Absolute: 0 10*3/uL (ref 0.0–0.1)
Basophils Relative: 1 %
Eosinophils Absolute: 0.1 10*3/uL (ref 0.0–0.5)
Eosinophils Relative: 1 %
HCT: 37.8 % (ref 36.0–46.0)
Hemoglobin: 12.4 g/dL (ref 12.0–15.0)
Immature Granulocytes: 0 %
Lymphocytes Relative: 32 %
Lymphs Abs: 1.9 10*3/uL (ref 0.7–4.0)
MCH: 35 pg — ABNORMAL HIGH (ref 26.0–34.0)
MCHC: 32.8 g/dL (ref 30.0–36.0)
MCV: 106.8 fL — ABNORMAL HIGH (ref 80.0–100.0)
Monocytes Absolute: 0.4 10*3/uL (ref 0.1–1.0)
Monocytes Relative: 6 %
Neutro Abs: 3.6 10*3/uL (ref 1.7–7.7)
Neutrophils Relative %: 60 %
Platelets: 336 10*3/uL (ref 150–400)
RBC: 3.54 MIL/uL — ABNORMAL LOW (ref 3.87–5.11)
RDW: 16.7 % — ABNORMAL HIGH (ref 11.5–15.5)
Smear Review: NORMAL
WBC: 5.9 10*3/uL (ref 4.0–10.5)
nRBC: 0.7 % — ABNORMAL HIGH (ref 0.0–0.2)

## 2022-01-04 LAB — VITAMIN B12: Vitamin B-12: 225 pg/mL (ref 180–914)

## 2022-01-04 LAB — FOLATE: Folate: 7.8 ng/mL (ref >5.9)

## 2022-01-04 NOTE — Assessment & Plan Note (Signed)
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 Check CBC, smear, CMP, B12, folate, reticulocyte panel, multiple myeloma panel, light chain panel,

## 2022-01-04 NOTE — Assessment & Plan Note (Signed)
Chronic.  Likely secondary to autoimmune disease and medication. Check peripheral blood flow cytometry.
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
## 2022-01-05 LAB — KAPPA/LAMBDA LIGHT CHAINS
Kappa free light chain: 19.8 mg/L — ABNORMAL HIGH (ref 3.3–19.4)
Kappa, lambda light chain ratio: 1.3 (ref 0.26–1.65)
Lambda free light chains: 15.2 mg/L (ref 5.7–26.3)

## 2022-01-06 LAB — COMP PANEL: LEUKEMIA/LYMPHOMA

## 2022-01-09 LAB — MULTIPLE MYELOMA PANEL, SERUM
Albumin SerPl Elph-Mcnc: 3.8 g/dL (ref 2.9–4.4)
Albumin/Glob SerPl: 1.2 (ref 0.7–1.7)
Alpha 1: 0.3 g/dL (ref 0.0–0.4)
Alpha2 Glob SerPl Elph-Mcnc: 0.6 g/dL (ref 0.4–1.0)
B-Globulin SerPl Elph-Mcnc: 1.1 g/dL (ref 0.7–1.3)
Gamma Glob SerPl Elph-Mcnc: 1.4 g/dL (ref 0.4–1.8)
Globulin, Total: 3.4 g/dL (ref 2.2–3.9)
IgA: 170 mg/dL (ref 64–422)
IgG (Immunoglobin G), Serum: 1487 mg/dL (ref 586–1602)
IgM (Immunoglobulin M), Srm: 31 mg/dL (ref 26–217)
Total Protein ELP: 7.2 g/dL (ref 6.0–8.5)

## 2022-01-19 ENCOUNTER — Ambulatory Visit
Admission: RE | Admit: 2022-01-19 | Discharge: 2022-01-19 | Disposition: A | Discharge status: Home / Self Care | Payer: Medicare Other | Source: Ambulatory Visit | Attending: Family Medicine | Admitting: Family Medicine

## 2022-01-19 DIAGNOSIS — Z1231 Encounter for screening mammogram for malignant neoplasm of breast: Secondary | ICD-10-CM | POA: Diagnosis present

## 2022-02-03 ENCOUNTER — Ambulatory Visit: Payer: Medicare Other | Admitting: Oncology

## 2022-02-20 ENCOUNTER — Ambulatory Visit: Payer: Medicare Other | Admitting: Oncology

## 2022-03-01 ENCOUNTER — Inpatient Hospital Stay: Payer: Medicare Other | Admitting: Oncology

## 2022-03-16 ENCOUNTER — Ambulatory Visit: Payer: Medicare Other | Admitting: Oncology

## 2023-01-07 ENCOUNTER — Observation Stay
Admission: EM | Admit: 2023-01-07 | Discharge: 2023-01-08 | Disposition: A | Discharge status: Home / Self Care | Payer: 59 | Attending: Emergency Medicine | Admitting: Emergency Medicine

## 2023-01-07 ENCOUNTER — Other Ambulatory Visit: Payer: Self-pay

## 2023-01-07 ENCOUNTER — Emergency Department: Payer: 59

## 2023-01-07 DIAGNOSIS — E876 Hypokalemia: Secondary | ICD-10-CM | POA: Diagnosis not present

## 2023-01-07 DIAGNOSIS — R2681 Unsteadiness on feet: Secondary | ICD-10-CM | POA: Insufficient documentation

## 2023-01-07 DIAGNOSIS — N390 Urinary tract infection, site not specified: Secondary | ICD-10-CM | POA: Diagnosis not present

## 2023-01-07 DIAGNOSIS — R2689 Other abnormalities of gait and mobility: Secondary | ICD-10-CM | POA: Insufficient documentation

## 2023-01-07 DIAGNOSIS — F17210 Nicotine dependence, cigarettes, uncomplicated: Secondary | ICD-10-CM | POA: Insufficient documentation

## 2023-01-07 DIAGNOSIS — R531 Weakness: Secondary | ICD-10-CM | POA: Diagnosis present

## 2023-01-07 DIAGNOSIS — Z79899 Other long term (current) drug therapy: Secondary | ICD-10-CM | POA: Diagnosis not present

## 2023-01-07 DIAGNOSIS — M6281 Muscle weakness (generalized): Secondary | ICD-10-CM | POA: Diagnosis not present

## 2023-01-07 DIAGNOSIS — I160 Hypertensive urgency: Secondary | ICD-10-CM | POA: Diagnosis not present

## 2023-01-07 DIAGNOSIS — M059 Rheumatoid arthritis with rheumatoid factor, unspecified: Secondary | ICD-10-CM | POA: Diagnosis present

## 2023-01-07 DIAGNOSIS — I10 Essential (primary) hypertension: Principal | ICD-10-CM

## 2023-01-07 DIAGNOSIS — W19XXXA Unspecified fall, initial encounter: Secondary | ICD-10-CM

## 2023-01-07 DIAGNOSIS — H409 Unspecified glaucoma: Secondary | ICD-10-CM | POA: Diagnosis present

## 2023-01-07 LAB — BASIC METABOLIC PANEL
Anion gap: 14 (ref 5–15)
BUN: 12 mg/dL (ref 8–23)
CO2: 23 mmol/L (ref 22–32)
Calcium: 9.8 mg/dL (ref 8.9–10.3)
Chloride: 98 mmol/L (ref 98–111)
Creatinine, Ser: 1.14 mg/dL — ABNORMAL HIGH (ref 0.44–1.00)
GFR, Estimated: 51 mL/min — ABNORMAL LOW (ref >60)
Glucose, Bld: 108 mg/dL — ABNORMAL HIGH (ref 70–99)
Potassium: 2.9 mmol/L — ABNORMAL LOW (ref 3.5–5.1)
Sodium: 135 mmol/L (ref 135–145)

## 2023-01-07 LAB — CBG MONITORING, ED: Glucose-Capillary: 86 mg/dL (ref 70–99)

## 2023-01-07 LAB — CBC
HCT: 40.7 % (ref 36.0–46.0)
Hemoglobin: 13.8 g/dL (ref 12.0–15.0)
MCH: 35.7 pg — ABNORMAL HIGH (ref 26.0–34.0)
MCHC: 33.9 g/dL (ref 30.0–36.0)
MCV: 105.2 fL — ABNORMAL HIGH (ref 80.0–100.0)
Platelets: 216 10*3/uL (ref 150–400)
RBC: 3.87 MIL/uL (ref 3.87–5.11)
RDW: 16.3 % — ABNORMAL HIGH (ref 11.5–15.5)
WBC: 5.7 10*3/uL (ref 4.0–10.5)
nRBC: 1.1 % — ABNORMAL HIGH (ref 0.0–0.2)

## 2023-01-07 LAB — TROPONIN I (HIGH SENSITIVITY): Troponin I (High Sensitivity): 4 ng/L (ref <18)

## 2023-01-07 MED ORDER — POTASSIUM CHLORIDE CRYS ER 20 MEQ PO TBCR
40.0000 meq | EXTENDED_RELEASE_TABLET | Freq: Once | ORAL | Status: AC
  Administered 2023-01-07: 40 meq via ORAL
  Filled 2023-01-07: qty 2

## 2023-01-07 MED ORDER — POTASSIUM CHLORIDE CRYS ER 20 MEQ PO TBCR: 40.0000 meq | EXTENDED_RELEASE_TABLET | Freq: Once | ORAL | Status: DC

## 2023-01-07 MED ORDER — AMLODIPINE BESYLATE 5 MG PO TABS
5.0000 mg | ORAL_TABLET | Freq: Once | ORAL | Status: AC
  Administered 2023-01-07: 5 mg via ORAL
  Filled 2023-01-07: qty 1

## 2023-01-07 NOTE — ED Provider Notes (Signed)
11:30 PM  Assumed care at shift change.  Patient here after a fall.  Imaging pending.  Also has elevated blood pressure.  Will need to reassess after amlodipine.  12:24 AM  Pt's imaging reviewed and interpreted by myself and the radiologist and shows no acute traumatic injury.  Patient's magnesium level is normal.  She is getting oral replacement for her hypokalemia.  Blood pressure improved slightly with 5 of amlodipine.  Will give another dose of amlodipine.  Urine pending.  1:34 AM Pt's urine appears infected.  Will add on urine culture.  Will give Rocephin.  Patient still having extremely elevated blood pressures despite amlodipine and IV hydralazine.  She states this is abnormal for her.  Given her blood pressures, UTI and generalized weakness, have recommended admission and she agrees.   Sayaka Hoeppner, Layla Maw, DO 01/08/23 [phone]

## 2023-01-07 NOTE — ED Triage Notes (Signed)
Pt reports generalized weakness in legs and has had falls at home. Pt unable to provide thorough history in triage. Pt reports pain from arthritis. Pt denies any injuries after fall.
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
## 2023-01-07 NOTE — ED Notes (Signed)
PER EMS: pt is from home with c/o generalized weakness onset 12 hours ago associated with multiple falls recently, but no falls today. Denies syncope, CP, or dizziness. A&XOx4.  BP-190/110, HR-75, 98% RA 20g LFA

## 2023-01-08 ENCOUNTER — Encounter: Payer: Self-pay | Admitting: Family Medicine

## 2023-01-08 DIAGNOSIS — R531 Weakness: Secondary | ICD-10-CM

## 2023-01-08 DIAGNOSIS — E876 Hypokalemia: Secondary | ICD-10-CM

## 2023-01-08 DIAGNOSIS — N390 Urinary tract infection, site not specified: Secondary | ICD-10-CM

## 2023-01-08 DIAGNOSIS — I160 Hypertensive urgency: Secondary | ICD-10-CM

## 2023-01-08 LAB — CBC
HCT: 42 % (ref 36.0–46.0)
Hemoglobin: 14.2 g/dL (ref 12.0–15.0)
MCH: 34.9 pg — ABNORMAL HIGH (ref 26.0–34.0)
MCHC: 33.8 g/dL (ref 30.0–36.0)
MCV: 103.2 fL — ABNORMAL HIGH (ref 80.0–100.0)
Platelets: 248 10*3/uL (ref 150–400)
RBC: 4.07 MIL/uL (ref 3.87–5.11)
RDW: 16 % — ABNORMAL HIGH (ref 11.5–15.5)
WBC: 6.6 10*3/uL (ref 4.0–10.5)
nRBC: 0.9 % — ABNORMAL HIGH (ref 0.0–0.2)

## 2023-01-08 LAB — URINALYSIS, ROUTINE W REFLEX MICROSCOPIC
Bilirubin Urine: NEGATIVE
Glucose, UA: NEGATIVE mg/dL
Ketones, ur: NEGATIVE mg/dL
Nitrite: NEGATIVE
Protein, ur: NEGATIVE mg/dL
Specific Gravity, Urine: 1.01 (ref 1.005–1.030)
pH: 5 (ref 5.0–8.0)

## 2023-01-08 LAB — BASIC METABOLIC PANEL
Anion gap: 9 (ref 5–15)
BUN: 9 mg/dL (ref 8–23)
CO2: 24 mmol/L (ref 22–32)
Calcium: 9.2 mg/dL (ref 8.9–10.3)
Chloride: 103 mmol/L (ref 98–111)
Creatinine, Ser: 0.87 mg/dL (ref 0.44–1.00)
GFR, Estimated: >60 mL/min (ref >60)
Glucose, Bld: 86 mg/dL (ref 70–99)
Potassium: 3.4 mmol/L — ABNORMAL LOW (ref 3.5–5.1)
Sodium: 136 mmol/L (ref 135–145)

## 2023-01-08 LAB — MAGNESIUM: Magnesium: 2.2 mg/dL (ref 1.7–2.4)

## 2023-01-08 MED ORDER — CEFADROXIL 500 MG PO CAPS: 500.0000 mg | ORAL_CAPSULE | Freq: Two times a day (BID) | ORAL | 0 refills | Status: AC

## 2023-01-08 MED ORDER — SODIUM CHLORIDE 0.9 % IV SOLN
1.0000 g | Freq: Once | INTRAVENOUS | Status: AC
  Administered 2023-01-08: 1 g via INTRAVENOUS
  Filled 2023-01-08: qty 10

## 2023-01-08 MED ORDER — HYDRALAZINE HCL 20 MG/ML IJ SOLN
10.0000 mg | Freq: Once | INTRAMUSCULAR | Status: AC
  Administered 2023-01-08: 10 mg via INTRAVENOUS
  Filled 2023-01-08: qty 1

## 2023-01-08 MED ORDER — MAGNESIUM HYDROXIDE 400 MG/5ML PO SUSP: 30.0000 mL | Freq: Every day | ORAL | Status: DC | PRN

## 2023-01-08 MED ORDER — TRAZODONE HCL 50 MG PO TABS: 25.0000 mg | ORAL_TABLET | Freq: Every evening | ORAL | Status: DC | PRN

## 2023-01-08 MED ORDER — ONDANSETRON HCL 4 MG/2ML IJ SOLN: 4.0000 mg | Freq: Four times a day (QID) | INTRAMUSCULAR | Status: DC | PRN

## 2023-01-08 MED ORDER — DORZOLAMIDE HCL 2 % OP SOLN
1.0000 [drp] | Freq: Three times a day (TID) | OPHTHALMIC | Status: DC
  Administered 2023-01-08 (×2): 1 [drp] via OPHTHALMIC
  Filled 2023-01-08: qty 10

## 2023-01-08 MED ORDER — FOLIC ACID 1 MG PO TABS
1.0000 mg | ORAL_TABLET | Freq: Every day | ORAL | Status: DC
  Administered 2023-01-08: 1 mg via ORAL
  Filled 2023-01-08: qty 1

## 2023-01-08 MED ORDER — AMLODIPINE BESYLATE 5 MG PO TABS
5.0000 mg | ORAL_TABLET | Freq: Once | ORAL | Status: AC
  Administered 2023-01-08: 5 mg via ORAL
  Filled 2023-01-08: qty 1

## 2023-01-08 MED ORDER — ACETAMINOPHEN 325 MG PO TABS: 650.0000 mg | ORAL_TABLET | Freq: Four times a day (QID) | ORAL | Status: DC | PRN

## 2023-01-08 MED ORDER — GABAPENTIN 100 MG PO CAPS: 200.0000 mg | ORAL_CAPSULE | Freq: Every day | ORAL | Status: DC

## 2023-01-08 MED ORDER — POTASSIUM CHLORIDE IN NACL 20-0.9 MEQ/L-% IV SOLN
INTRAVENOUS | Status: DC
  Filled 2023-01-08 (×3): qty 1000

## 2023-01-08 MED ORDER — SODIUM CHLORIDE 0.9 % IV SOLN: 1.0000 g | INTRAVENOUS | Status: DC

## 2023-01-08 MED ORDER — VITAMIN B-12 1000 MCG PO TABS: 1000.0000 ug | ORAL_TABLET | Freq: Every day | ORAL | 2 refills | Status: AC

## 2023-01-08 MED ORDER — ONDANSETRON HCL 4 MG PO TABS: 4.0000 mg | ORAL_TABLET | Freq: Four times a day (QID) | ORAL | Status: DC | PRN

## 2023-01-08 MED ORDER — ACETAMINOPHEN 650 MG RE SUPP: 650.0000 mg | Freq: Four times a day (QID) | RECTAL | Status: DC | PRN

## 2023-01-08 MED ORDER — CEFADROXIL 500 MG PO CAPS
500.0000 mg | ORAL_CAPSULE | Freq: Two times a day (BID) | ORAL | Status: DC
  Administered 2023-01-08: 500 mg via ORAL
  Filled 2023-01-08 (×2): qty 1

## 2023-01-08 MED ORDER — ENOXAPARIN SODIUM 40 MG/0.4ML IJ SOSY
40.0000 mg | PREFILLED_SYRINGE | INTRAMUSCULAR | Status: DC
  Administered 2023-01-08: 40 mg via SUBCUTANEOUS
  Filled 2023-01-08: qty 0.4

## 2023-01-08 MED ORDER — OLOPATADINE HCL 0.1 % OP SOLN: 1.0000 [drp] | Freq: Two times a day (BID) | OPHTHALMIC | Status: DC | PRN

## 2023-01-08 MED ORDER — AMLODIPINE BESYLATE 10 MG PO TABS: 10.0000 mg | ORAL_TABLET | Freq: Every day | ORAL | 2 refills | Status: AC

## 2023-01-08 MED ORDER — NETARSUDIL-LATANOPROST 0.02-0.005 % OP SOLN: 1.0000 [drp] | Freq: Every day | OPHTHALMIC | Status: DC

## 2023-01-08 MED ORDER — AMLODIPINE BESYLATE 10 MG PO TABS
10.0000 mg | ORAL_TABLET | Freq: Every day | ORAL | Status: DC
  Administered 2023-01-08: 10 mg via ORAL
  Filled 2023-01-08: qty 1

## 2023-01-08 MED ORDER — VITAMIN B-12 1000 MCG PO TABS
500.0000 ug | ORAL_TABLET | Freq: Every day | ORAL | Status: DC
  Administered 2023-01-08: 500 ug via ORAL
  Filled 2023-01-08: qty 1

## 2023-01-08 NOTE — Assessment & Plan Note (Signed)
-   We will continue her methotrexate.  She is also on Enbrel weekly.

## 2023-01-08 NOTE — Assessment & Plan Note (Signed)
-   We will continue antihypertensive therapy. - The patient be placed on as needed IV labetalol

## 2023-01-08 NOTE — Discharge Instructions (Addendum)
Smoking cessation advised. Use your walker to ambulate Keep log of BP at home for PCP to review

## 2023-01-08 NOTE — Assessment & Plan Note (Signed)
-   The patient will be placed on IV Rocephin and will follow urine culture and sensitivity.

## 2023-01-08 NOTE — Assessment & Plan Note (Signed)
-   Potassium will be replaced and magnesium level will be checked. ?

## 2023-01-08 NOTE — Discharge Summary (Signed)
Physician Discharge Summary   Patient: Summer Marshall MRN: [id number] DOB: 1951-03-14  Admit date:     01/07/2023  Discharge date: 01/08/23  Discharge Physician: Enedina Finner   PCP: Dorothey Baseman, MD   Recommendations at discharge:    Pt to f/u Dr Terance Hart in 1-2 weeks Pt to call and make appt with Dr Allena Katz Rheumatology   Discharge Diagnoses: Principal Problem:   Generalized weakness Active Problems:   Acute lower UTI   Hypertensive urgency   Hypokalemia   Seropositive rheumatoid arthritis (HCC)   Glaucoma   Summer Marshall is a 72 y.o. female with medical history significant for essential hypertension, rheumatoid arthritis, glaucoma and alcoholism, who presented to the emergency room with acute onset of generalized weakness and recent couple of falls on Saturday.    Generalized weakness with fall, nontraumatic -- multifactorial could be UTI, hypokalemia, poor PO intake -- patient received IV fluids with IV KCl. Potassium levels improved. -- Tolerated PO diet well. -- She will finish course of PO antibiotic for UTI -- patient worked with physical therapy she ambulated with rolling walker very well. Advised to continue using it. -- Encouraged to eat healthy and appropriate meals three times a day at least  Hypertension, malignant -- patient has not been taking meds at home per sister -- will continue amlodipine blood pressure much improved  Hypokalemia -- received IV potassium  Seropositive rheumatoid arthritis -- patient follows with [facility] rheumatology Dr. Allena Katz. She has missed few doses of IV shots Q weekly. She is recommended to follow-up and make appointment to resume treatment  History of vitamin B12 deficiency -- patient recommended to resume PO vitamin B12  Glaucoma -- continue eyedrops  Tobacco abuse -- advised cessation  Patient will discharge back to her previous living arrangement. Home health PT arranged. About was discussed with patient's sister Ms.  Johnson on the phone     Consultants: none Disposition: Home health Diet recommendation:  Discharge Diet Orders (From admission, onward)     Start     Ordered   01/08/23 0000  Diet - low sodium heart healthy        01/08/23 1312           Cardiac diet DISCHARGE MEDICATION: Allergies as of 01/08/2023       Reactions   Fexofenadine Other (See Comments)   Elevated BP        Medication List     STOP taking these medications    methotrexate 2.5 MG tablet Commonly known as: RHEUMATREX       TAKE these medications    amLODipine 10 MG tablet Commonly known as: NORVASC Take 1 tablet (10 mg total) by mouth daily. Start taking on: January 09, 2023   cefadroxil 500 MG capsule Commonly known as: DURICEF Take 1 capsule (500 mg total) by mouth 2 (two) times daily for 4 days.   cyanocobalamin 1000 MCG tablet Commonly known as: VITAMIN B12 Take 1 tablet (1,000 mcg total) by mouth daily. What changed:  how much to take when to take this   dorzolamide 2 % ophthalmic solution Commonly known as: TRUSOPT INSTILL 1 DROP INTO BOTH EYES THREE TIMES A DAY   Enbrel SureClick 50 MG/ML injection Generic drug: etanercept 50 mg once a week.   folic acid 1 MG tablet Commonly known as: FOLVITE Take 1 mg by mouth daily.   gabapentin 100 MG capsule Commonly known as: NEURONTIN Take 200 mg by mouth at bedtime.   olopatadine 0.1 %  ophthalmic solution Commonly known as: PATANOL Place 1 drop into both eyes 2 (two) times daily.   Rocklatan 0.02-0.005 % Soln Generic drug: Netarsudil-Latanoprost Place 1 drop into both eyes at bedtime.        Follow-up Information     Dorothey Baseman, MD. Schedule an appointment as soon as possible for a visit in 1 week(s).   Specialty: Family Medicine Contact information: [address] [place] [address] [phone]         Patterson Hammersmith, MD. Call in 1 week(s).   Specialty: Rheumatology Why: pt advised to call and make  appt for f/u enbrel shots for RA Contact information: [address] [place] [address] [phone]                Discharge Exam: Ceasar Mons Weights   01/07/23 1944  Weight: 49.9 kg   Alert and oriented times three  cardiovascular both heart sounds normal no murmur  respiratory clear to auscultation abdomen soft benign nontender neuro- grossly intact extremity patient has significant arthritic changes in both upper extremities  Condition at discharge: fair  The results of significant diagnostics from this hospitalization (including imaging, microbiology, ancillary and laboratory) are listed below for reference.   Imaging Studies: DG Chest 2 View  Result Date: 01/07/2023 CLINICAL DATA:  Fall with mild pain over left lateral. Generalized weakness. Multiple falls recently EXAM: CHEST - 2 VIEW COMPARISON:  None Available. FINDINGS: The heart size and mediastinal contours are within normal limits. Aortic atherosclerotic calcification. Both lungs are clear. The visualized skeletal structures are unremarkable. IMPRESSION: No active cardiopulmonary disease. Electronically Signed   By: Minerva Fester M.D.   On: 01/07/2023 23:49   DG Shoulder Left  Result Date: 01/07/2023 CLINICAL DATA:  Fall, mild pain over left shoulder. EXAM: LEFT SHOULDER - 2+ VIEW COMPARISON:  None Available. FINDINGS: There is no evidence of fracture or dislocation. Ossific densities about the greater tuberosity may be due to calcific tendinopathy. Degenerative arthritis AC and glenohumeral joints. Soft tissues are unremarkable. IMPRESSION: No acute fracture or dislocation. Electronically Signed   By: Minerva Fester M.D.   On: 01/07/2023 23:48   CT Head Wo Contrast  Result Date: 01/07/2023 CLINICAL DATA:  Generalized weakness and falls at home. Unable to provide thorough history. EXAM: CT HEAD WITHOUT CONTRAST TECHNIQUE: Contiguous axial images were obtained from the base of the skull through the vertex  without intravenous contrast. RADIATION DOSE REDUCTION: This exam was performed according to the departmental dose-optimization program which includes automated exposure control, adjustment of the mA and/or kV according to patient size and/or use of iterative reconstruction technique. COMPARISON:  None are available FINDINGS: Brain: No intracranial hemorrhage, mass effect, or evidence of acute infarct. No hydrocephalus. No extra-axial fluid collection. Age-commensurate cerebral atrophy and chronic small vessel ischemic disease. Vascular: No hyperdense vessel. Intracranial arterial calcification. Skull: No fracture or focal lesion. Sinuses/Orbits: No acute finding. Other: None. IMPRESSION: No acute intracranial abnormality. Electronically Signed   By: Minerva Fester M.D.   On: 01/07/2023 23:42    Microbiology: Results for orders placed or performed in visit on 01/28/19  Novel Coronavirus, NAA ([facility])     Status: None   Collection Time: 01/28/19  2:20 PM   Specimen: Oropharyngeal(OP) collection in vial transport medium   OROPHARYNGEA  TESTING  Result Value Ref Range Status   SARS-CoV-2, NAA Not Detected Not Detected Final    Comment: This nucleic acid amplification test was developed and its performance characteristics determined by  [employer]. Nucleic acid amplification tests include PCR and TMA. This test has not been FDA cleared or approved. This test has been authorized by FDA under an Emergency Use Authorization (EUA). This test is only authorized for the duration of time the declaration that circumstances exist justifying the authorization of the emergency use of in vitro diagnostic tests for detection of SARS-CoV-2 virus and/or diagnosis of COVID-19 infection under section 564(b)(1) of the Act, 21 U.S.C. 409WJX-9(J) (1), unless the authorization is terminated or revoked sooner. When diagnostic testing is negative, the possibility of a false negative result should be considered  in the context of a patient's recent exposures and the presence of clinical signs and symptoms consistent with COVID-19. An individual without symptoms of COVID-19 and who is not shedding SARS-CoV-2 virus would  expect to have a negative (not detected) result in this assay.     Labs: CBC: Recent Labs  Lab 01/07/23 1950 01/08/23 0545  WBC 5.7 6.6  HGB 13.8 14.2  HCT 40.7 42.0  MCV 105.2* 103.2*  PLT 216 248   Basic Metabolic Panel: Recent Labs  Lab 01/07/23 1949 01/07/23 1950 01/08/23 0545  NA  --  135 136  K  --  2.9* 3.4*  CL  --  98 103  CO2  --  23 24  GLUCOSE  --  108* 86  BUN  --  12 9  CREATININE  --  1.14* 0.87  CALCIUM  --  9.8 9.2  MG 2.2  --   --    Liver Function Tests: No results for input(s): "AST", "ALT", "ALKPHOS", "BILITOT", "PROT", "ALBUMIN" in the last 168 hours. CBG: Recent Labs  Lab 01/07/23 1948  GLUCAP 86    Discharge time spent: greater than 30 minutes.  Signed: Enedina Finner, MD [facility] 01/08/2023

## 2023-01-08 NOTE — TOC Transition Note (Signed)
Transition of Care [facility]) - CM/SW Discharge Note   Patient Details  Name: Summer Marshall MRN: [id number] Date of Birth: 1950-11-26  Transition of Care [facility]) CM/SW Contact:  Truddie Hidden, RN Phone Number: 01/08/2023, 1:40 PM   Clinical Narrative:    Spoke with patient regarding therapy's recommendation for [facility]. Patient stated she is trying to get herself together. She reports she had been living with her roommate and they have been helping each other . Patient stated she will follow upw ith her PCP MD when she feels ready to accept [facility]. She is agreeable to a RW. She was advised the RW would be delivered to her room. Patient's roommate [name] will transport he home.  Request for rolling walker was sent to Jon from [employer] for processing.   TOC signing off.            Patient Goals and CMS Choice      Discharge Placement                         Discharge Plan and Services Additional resources added to the After Visit Summary for                                       Social Determinants of Health (SDOH) Interventions SDOH Screenings   Food Insecurity: No Food Insecurity (01/08/2023)  Housing: Low Risk  (01/08/2023)  Transportation Needs: No Transportation Needs (01/08/2023)  Utilities: Not At Risk (01/08/2023)  Financial Resource Strain: Low Risk  (05/11/2017)  Physical Activity: Inactive (05/11/2017)  Social Connections: Moderately Isolated (05/11/2017)  Tobacco Use: High Risk (01/08/2023)     Readmission Risk Interventions     No data to display
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
## 2023-01-08 NOTE — Assessment & Plan Note (Signed)
-   This is likely the culprit for her recent couple of falls. - This could be multifactorial due to the acute UTI as well as hypokalemia in addition to hypertensive urgency. - Management as below.

## 2023-01-08 NOTE — Plan of Care (Signed)
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
## 2023-01-08 NOTE — Progress Notes (Signed)
Pt's friend, [name] came to pick her up. Pt's latest VS are stable. Denies any pain and difficulty of breathing. Pt is now discharged.

## 2023-01-08 NOTE — Assessment & Plan Note (Signed)
-   We will continue her ophthalmic gtt.

## 2023-01-09 LAB — URINE CULTURE

## 2023-02-03 ENCOUNTER — Emergency Department: Payer: 59

## 2023-02-03 ENCOUNTER — Other Ambulatory Visit: Payer: Self-pay

## 2023-02-03 DIAGNOSIS — Z79899 Other long term (current) drug therapy: Secondary | ICD-10-CM | POA: Insufficient documentation

## 2023-02-03 DIAGNOSIS — N179 Acute kidney failure, unspecified: Secondary | ICD-10-CM | POA: Insufficient documentation

## 2023-02-03 DIAGNOSIS — K5900 Constipation, unspecified: Secondary | ICD-10-CM | POA: Diagnosis present

## 2023-02-03 DIAGNOSIS — I10 Essential (primary) hypertension: Secondary | ICD-10-CM | POA: Insufficient documentation

## 2023-02-03 LAB — BASIC METABOLIC PANEL
Anion gap: 12 (ref 5–15)
BUN: 20 mg/dL (ref 8–23)
CO2: 23 mmol/L (ref 22–32)
Calcium: 9 mg/dL (ref 8.9–10.3)
Chloride: 100 mmol/L (ref 98–111)
Creatinine, Ser: 1.66 mg/dL — ABNORMAL HIGH (ref 0.44–1.00)
GFR, Estimated: 33 mL/min — ABNORMAL LOW (ref >60)
Glucose, Bld: 157 mg/dL — ABNORMAL HIGH (ref 70–99)
Potassium: 3.2 mmol/L — ABNORMAL LOW (ref 3.5–5.1)
Sodium: 135 mmol/L (ref 135–145)

## 2023-02-03 LAB — CBC
HCT: 44.8 % (ref 36.0–46.0)
Hemoglobin: 14.9 g/dL (ref 12.0–15.0)
MCH: 35.7 pg — ABNORMAL HIGH (ref 26.0–34.0)
MCHC: 33.3 g/dL (ref 30.0–36.0)
MCV: 107.4 fL — ABNORMAL HIGH (ref 80.0–100.0)
Platelets: 225 10*3/uL (ref 150–400)
RBC: 4.17 MIL/uL (ref 3.87–5.11)
RDW: 16.9 % — ABNORMAL HIGH (ref 11.5–15.5)
WBC: 8.1 10*3/uL (ref 4.0–10.5)
nRBC: 0.9 % — ABNORMAL HIGH (ref 0.0–0.2)
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
## 2023-02-03 NOTE — ED Notes (Signed)
This tech checked on pt in restroom in triage and stated that she couldn't provide a urine sample at the moment. I informed pt that she can try a little later to give sample.

## 2023-02-03 NOTE — ED Triage Notes (Signed)
Pt sts that over the last couple of weeks she has not been able to do things mentally and physically like she use to be able to do so.

## 2023-02-03 NOTE — ED Notes (Signed)
Patient's family called and stated that patient was probably embarrassed to tell staff what brought her here, but states she has been complaining of not being able to have BM since the last time she was seen here. States she is concerned for bowel blockage for patient.

## 2023-02-04 ENCOUNTER — Emergency Department
Admission: EM | Admit: 2023-02-04 | Discharge: 2023-02-04 | Disposition: A | Discharge status: Home / Self Care | Payer: 59 | Attending: Emergency Medicine | Admitting: Emergency Medicine

## 2023-02-04 DIAGNOSIS — K5900 Constipation, unspecified: Secondary | ICD-10-CM | POA: Diagnosis not present

## 2023-02-04 DIAGNOSIS — N179 Acute kidney failure, unspecified: Secondary | ICD-10-CM

## 2023-02-04 LAB — URINALYSIS, ROUTINE W REFLEX MICROSCOPIC
Glucose, UA: NEGATIVE mg/dL
Hgb urine dipstick: NEGATIVE
Ketones, ur: NEGATIVE mg/dL
Leukocytes,Ua: NEGATIVE
Nitrite: NEGATIVE
Protein, ur: 30 mg/dL — AB
Specific Gravity, Urine: 1.025 (ref 1.005–1.030)
pH: 5 (ref 5.0–8.0)

## 2023-02-04 MED ORDER — POLYETHYLENE GLYCOL 3350 17 G PO PACK
17.0000 g | PACK | Freq: Two times a day (BID) | ORAL | 0 refills | Status: AC
Start: 2023-02-04 — End: ?

## 2023-02-04 MED ORDER — HYDROXYZINE HCL 10 MG PO TABS
10.0000 mg | ORAL_TABLET | Freq: Once | ORAL | Status: AC
  Administered 2023-02-04: 10 mg via ORAL
  Filled 2023-02-04: qty 1

## 2023-02-04 MED ORDER — SODIUM CHLORIDE 0.9 % IV BOLUS (SEPSIS)
1000.0000 mL | Freq: Once | INTRAVENOUS | Status: AC
  Administered 2023-02-04: 1000 mL via INTRAVENOUS

## 2023-02-04 NOTE — ED Notes (Signed)
Pt stated the she left her key at home.  She is attempting call her roommate. Charge nurse notified.

## 2023-02-04 NOTE — ED Notes (Signed)
Sister called at this time for a ride back home. Sister stated that the gentleman that dropped her off may be the one to pick her up or her other sister.
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
## 2023-02-04 NOTE — Discharge Instructions (Addendum)
I recommend that you increase your water and fiber intake. If you are not able to eat foods high in fiber, you may use Benefiber or Metamucil over-the-counter. I also recommend you use MiraLAX 1-2 times a day and Colace 100 mg twice a day to help with bowel movements. These medications are over the counter.  You may use other over-the-counter medications such as Dulcolax, Fleet enemas, magnesium citrate as needed for constipation. Please note that some of these medications may cause you to have abdominal cramping which is normal. If you develop severe abdominal pain, fever (temperature of 100.4 or higher), persistent vomiting, distention of your abdomen, unable to have a bowel movement for 5 days or are not passing gas, please return to the hospital.   Your kidney function, creatinine, was slightly elevated today at 1.66.  I recommend you increase your water intake and have this rechecked by your PCP in 1 week.

## 2023-08-02 ENCOUNTER — Other Ambulatory Visit
Admission: RE | Admit: 2023-08-02 | Discharge: 2023-08-02 | Disposition: A | Discharge status: Home / Self Care | Source: Ambulatory Visit | Attending: Family Medicine | Admitting: Family Medicine

## 2023-08-02 DIAGNOSIS — R030 Elevated blood-pressure reading, without diagnosis of hypertension: Secondary | ICD-10-CM | POA: Diagnosis present

## 2023-08-02 DIAGNOSIS — M059 Rheumatoid arthritis with rheumatoid factor, unspecified: Secondary | ICD-10-CM | POA: Insufficient documentation

## 2023-08-02 LAB — CBC WITH DIFFERENTIAL/PLATELET
Abs Immature Granulocytes: 0.04 10*3/uL (ref 0.00–0.07)
Basophils Absolute: 0 10*3/uL (ref 0.0–0.1)
Basophils Relative: 0 %
Eosinophils Absolute: 0 10*3/uL (ref 0.0–0.5)
Eosinophils Relative: 0 %
HCT: 36.8 % (ref 36.0–46.0)
Hemoglobin: 11.8 g/dL — ABNORMAL LOW (ref 12.0–15.0)
Immature Granulocytes: 1 %
Lymphocytes Relative: 38 %
Lymphs Abs: 2 10*3/uL (ref 0.7–4.0)
MCH: 34 pg (ref 26.0–34.0)
MCHC: 32.1 g/dL (ref 30.0–36.0)
MCV: 106.1 fL — ABNORMAL HIGH (ref 80.0–100.0)
Monocytes Absolute: 0.3 10*3/uL (ref 0.1–1.0)
Monocytes Relative: 6 %
Neutro Abs: 2.9 10*3/uL (ref 1.7–7.7)
Neutrophils Relative %: 55 %
Platelets: 197 10*3/uL (ref 150–400)
RBC: 3.47 MIL/uL — ABNORMAL LOW (ref 3.87–5.11)
RDW: 20.9 % — ABNORMAL HIGH (ref 11.5–15.5)
Smear Review: NORMAL
WBC: 5.2 10*3/uL (ref 4.0–10.5)
nRBC: 3.1 % — ABNORMAL HIGH (ref 0.0–0.2)
